# Patient Record
Sex: Female | Born: 1985 | Race: Black or African American | Hispanic: No | Marital: Single | Smoking: Current some day smoker
Health system: Southern US, Community
[De-identification: ages and names within clinical notes are randomized; demographics above are authoritative.]

---

## 2017-10-25 ENCOUNTER — Emergency Department
Admission: EM | Admit: 2017-10-25 | Discharge: 2017-10-25 | Disposition: A | Discharge status: Home / Self Care | Payer: Self-pay | Attending: Emergency Medicine | Admitting: Emergency Medicine

## 2017-10-25 ENCOUNTER — Other Ambulatory Visit: Payer: Self-pay

## 2017-10-25 DIAGNOSIS — L0211 Cutaneous abscess of neck: Secondary | ICD-10-CM | POA: Insufficient documentation

## 2017-10-25 DIAGNOSIS — L0291 Cutaneous abscess, unspecified: Secondary | ICD-10-CM

## 2017-10-25 MED ORDER — HYDROCODONE-ACETAMINOPHEN 5-325 MG PO TABS: 1.0000 | ORAL_TABLET | ORAL | 0 refills | Status: DC | PRN

## 2017-10-25 MED ORDER — LIDOCAINE HCL (PF) 1 % IJ SOLN
INTRAMUSCULAR | Status: AC
  Administered 2017-10-25: 5 mL via INTRADERMAL
  Filled 2017-10-25: qty 5

## 2017-10-25 MED ORDER — SULFAMETHOXAZOLE-TRIMETHOPRIM 800-160 MG PO TABS
1.0000 | ORAL_TABLET | Freq: Once | ORAL | Status: AC
  Administered 2017-10-25: 1 via ORAL
  Filled 2017-10-25: qty 1

## 2017-10-25 MED ORDER — LIDOCAINE HCL (PF) 1 % IJ SOLN
5.0000 mL | Freq: Once | INTRAMUSCULAR | Status: AC
  Administered 2017-10-25: 5 mL via INTRADERMAL

## 2017-10-25 MED ORDER — SULFAMETHOXAZOLE-TRIMETHOPRIM 800-160 MG PO TABS: 1.0000 | ORAL_TABLET | Freq: Two times a day (BID) | ORAL | 0 refills | Status: AC

## 2017-10-25 NOTE — ED Notes (Signed)
Patient c/o painful bump/knot to posterior, upper, medial neck beginning at 0400 today. Patient reports she took ibuprofen, and applied a warm compress with no relief.

## 2017-10-25 NOTE — ED Notes (Signed)
Reviewed discharge instructions, follow-up care, and prescriptions with patient. Patient verbalized understanding of all information reviewed. Patient stable, with no distress noted at this time.    

## 2017-10-25 NOTE — ED Triage Notes (Signed)
Patient presents with "knot on her neck" that woke her up. Tender to touch. No obvious line of demarcation noted, no obvious sign of insect bite.

## 2017-10-25 NOTE — ED Notes (Signed)
ED Provider at bedside. 

## 2017-10-25 NOTE — ED Provider Notes (Signed)
[facility] Emergency Department Provider Note  Time seen: 5:40 AM  I have reviewed the triage vital signs and the nursing notes.   HISTORY  Chief Complaint Abscess    HPI [name] is a 32 y.o. female with no significant past medical history presents to the emergency department for painful area to the back of her neck.  According to the patient she awoke from sleep with a very painful area to the back of her neck.  Felt that it was a very tender bump on the back of her neck.  Denies any fever vomiting.  Has had an abscess previously in her legs but never in her neck.   History reviewed. No pertinent past medical history.  There are no active problems to display for this patient.   History reviewed. No pertinent surgical history.  Prior to Admission medications   Not on File    Allergies  Allergen Reactions  . Penicillins Rash    No family history on file.  Social History Social History   Tobacco Use  . Smoking status: Not on file  Substance Use Topics  . Alcohol use: Not on file  . Drug use: Not on file    Review of Systems Constitutional: Negative for fever. ENT: Posterior neck pain/tenderness Cardiovascular: Negative for chest pain. Respiratory: Negative for shortness of breath. Gastrointestinal: Negative for abdominal pain, vomiting Skin: Tender raised area to the back of the neck Neurological: Negative for headache All other ROS negative  ____________________________________________   PHYSICAL EXAM:  VITAL SIGNS: ED Triage Vitals  Enc Vitals Group     BP 10/25/17 0450 (!) 162/95     Pulse Rate 10/25/17 0450 88     Resp 10/25/17 0450 20     Temp 10/25/17 0450 98.2 F (36.8 C)     Temp Source 10/25/17 0450 Oral     SpO2 10/25/17 0450 100 %     Weight 10/25/17 0451 273 lb (123.8 kg)     Height 10/25/17 0451 5\' 1"  (1.549 m)     Head Circumference --      Peak Flow --      Pain Score 10/25/17 0451 8     Pain Loc --       Pain Edu? --      Excl. in GC? --     Constitutional: Alert and oriented. Well appearing and in no distress. Eyes: Normal exam ENT   Head: Normocephalic and atraumatic.   Mouth/Throat: Mucous membranes are moist. Cardiovascular: Normal rate, regular rhythm. No murmur Respiratory: Normal respiratory effort without tachypnea nor retractions. Breath sounds are clear Gastrointestinal: Soft and nontender. No distention.   Musculoskeletal: Nontender with normal range of motion in all extremities.  Neurologic:  Normal speech and language. No gross focal neurologic deficits  Skin: Patient has an approximate 2 x 2 centimeter area of tenderness and induration to the back of her neck. Psychiatric: Mood and affect are normal.    INITIAL IMPRESSION / ASSESSMENT AND PLAN / ED COURSE  Pertinent labs & imaging results that were available during my care of the patient were reviewed by me and considered in my medical decision making (see chart for details).  She presents to the emergency department with a tender area to the back of the neck.  Differential to include cellulitis, abscess, muscular pain.  Bedside ultrasound performed by myself shows an approximate 1.5 x 1.5 cm area which appears to be consistent with a fluid-filled abscess/cyst.  I  numbed the area with lidocaine inserted an 18-gauge needle and removed approximately 1 to 2 cc of pus.  I then made a larger incision with an 11 blade and used a blunt Q-tip for blunt dissection with an additional 2 cc of pus removed.  Patient states that feeling much better.  We will cover with antibiotics and have the patient follow-up with her doctor.  INCISION AND DRAINAGE Performed by: Minna Antis Consent: Verbal consent obtained. Risks and benefits: risks, benefits and alternatives were discussed Type: abscess  Body area: posterior neck  Anesthesia: local infiltration  Incision was made with a scalpel.  Local anesthetic: lidocaine  1% wo epinephrine  Anesthetic total: 4 ml  Complexity: complex Blunt dissection to break up loculations  Drainage: purulent  Drainage amount: 4cc  Patient tolerance: Patient tolerated the procedure well with no immediate complications.    ____________________________________________   FINAL CLINICAL IMPRESSION(S) / ED DIAGNOSES  Abscess    Minna Antis, MD 10/25/17 [phone]

## 2017-12-14 ENCOUNTER — Emergency Department
Admission: EM | Admit: 2017-12-14 | Discharge: 2017-12-14 | Disposition: A | Discharge status: Home / Self Care | Payer: Self-pay | Attending: Student in an Organized Health Care Education/Training Program | Admitting: Student in an Organized Health Care Education/Training Program

## 2017-12-14 ENCOUNTER — Encounter: Payer: Self-pay | Admitting: Emergency Medicine

## 2017-12-14 ENCOUNTER — Other Ambulatory Visit: Payer: Self-pay

## 2017-12-14 DIAGNOSIS — L0291 Cutaneous abscess, unspecified: Secondary | ICD-10-CM

## 2017-12-14 DIAGNOSIS — L02214 Cutaneous abscess of groin: Secondary | ICD-10-CM | POA: Insufficient documentation

## 2017-12-14 MED ORDER — CLINDAMYCIN HCL 150 MG PO CAPS: 300.0000 mg | ORAL_CAPSULE | Freq: Three times a day (TID) | ORAL | 0 refills | Status: DC

## 2017-12-14 MED ORDER — TRAMADOL HCL 50 MG PO TABS: 50.0000 mg | ORAL_TABLET | Freq: Four times a day (QID) | ORAL | 0 refills | Status: DC | PRN

## 2017-12-14 NOTE — ED Triage Notes (Signed)
Patient complaining of multiple abscesses, has area at panty line and one at bra line. Has used warm compresses without success.  Areas present since Friday.

## 2017-12-14 NOTE — ED Notes (Signed)
See triage note  States she noticed hard swollen area to left groin last Friday  Then also noticed a tender area to right side of back  Near bra line

## 2017-12-14 NOTE — Discharge Instructions (Signed)
Continue to apply warm compresses to the affected area.  Take medication as prescribed.  [facility] on [address].  Return if worsening or call the surgeon whose number is been provided for you.

## 2017-12-14 NOTE — ED Provider Notes (Signed)
[facility] Emergency Department Provider Note  ____________________________________________   First MD Initiated Contact with Patient 12/14/17 [phone]     (approximate)  I have reviewed the triage vital signs and the nursing notes.   HISTORY  Chief Complaint Abscess    HPI Catherine Castro is a 32 y.o. female presents emergency department complaining of a hard swollen area to the left groin since Friday.  She is also got a tender area on the right side of her back in between the folds of her skin.  She states she is been applying warm compresses to the areas and that this usually helps.  She states usually they burst on their own.  She denies any fever or chills at this time.  States it hurts to walk.  History reviewed. No pertinent past medical history.  There are no active problems to display for this patient.   History reviewed. No pertinent surgical history.  Prior to Admission medications   Medication Sig Start Date End Date Taking? Authorizing Provider  clindamycin (CLEOCIN) 150 MG capsule Take 2 capsules (300 mg total) by mouth 3 (three) times daily. 12/14/17   Aaria Happ, Roselyn BeringSusan W, PA-C  traMADol (ULTRAM) 50 MG tablet Take 1 tablet (50 mg total) by mouth every 6 (six) hours as needed. 12/14/17   Faythe GheeFisher, Riker Collier W, PA-C    Allergies Penicillins  No family history on file.  Social History Social History   Tobacco Use  . Smoking status: Not on file  Substance Use Topics  . Alcohol use: Not on file  . Drug use: Not on file    Review of Systems  Constitutional: No fever/chills Eyes: No visual changes. ENT: No sore throat. Respiratory: Denies cough Genitourinary: Negative for dysuria. Musculoskeletal: Negative for back pain. Skin: Negative for rash.  Positive for multiple abscesses    ____________________________________________   PHYSICAL EXAM:  VITAL SIGNS: ED Triage Vitals  Enc Vitals Group     BP 12/14/17 0836 (!) 148/74   Pulse Rate 12/14/17 0836 94     Resp 12/14/17 0836 16     Temp 12/14/17 0836 98.6 F (37 C)     Temp Source 12/14/17 0836 Oral     SpO2 12/14/17 0836 98 %     Weight 12/14/17 0837 275 lb (124.7 kg)     Height 12/14/17 0837 5\' 1"  (1.549 m)     Head Circumference --      Peak Flow --      Pain Score 12/14/17 0836 10     Pain Loc --      Pain Edu? --      Excl. in GC? --     Constitutional: Alert and oriented. Well appearing and in no acute distress. Eyes: Conjunctivae are normal.  Head: Atraumatic. Nose: No congestion/rhinnorhea. Mouth/Throat: Mucous membranes are moist.   Cardiovascular: Normal rate, regular rhythm. Respiratory: Normal respiratory effort.  No retractions GU: deferred Musculoskeletal: FROM all extremities, warm and well perfused Neurologic:  Normal speech and language.  Skin:  Skin is warm, dry and intact. No rash noted.  Positive for a hard indurated area in the left inguinal.  No fluctuance is noted.  No redness is noted.  There is a small pimple on the right upper back in the fold of the skin.  This is also hard with no fluctuance or drainage noted. Psychiatric: Mood and affect are normal. Speech and behavior are normal.  ____________________________________________   LABS (all labs ordered are listed, but  only abnormal results are displayed)  Labs Reviewed - No data to display ____________________________________________   ____________________________________________  RADIOLOGY    ____________________________________________   PROCEDURES  Procedure(s) performed: No  Procedures    ____________________________________________   INITIAL IMPRESSION / ASSESSMENT AND PLAN / ED COURSE  Pertinent labs & imaging results that were available during my care of the patient were reviewed by me and considered in my medical decision making (see chart for details).  Patient is 32 year old female presents emergency department complaining of 2 abscesses.   She states her son usually open on their own with warm compresses but this is not helped.  On physical exam the left inguinal area has a hard indurated area with no fluctuance or drainage noted.  The right upper back has a quarter sized pimple which is still hard and not fluctuant with no drainage.  Explained the findings to the patient.  Explained to her at this time if we lanced these their own would be blood as they are not fluctuant.  She is to continue to apply the warm compresses.  Take medication as prescribed.  Return to the emergency department or call surgeon if worsening.  She states she understands to comply with instructions.  Was discharged in stable condition     As part of my medical decision making, I reviewed the following data within the electronic MEDICAL RECORD NUMBER Nursing notes reviewed and incorporated, Old chart reviewed, Notes from prior ED visits and [place] Controlled Substance Database  ____________________________________________   FINAL CLINICAL IMPRESSION(S) / ED DIAGNOSES  Final diagnoses:  Abscess      NEW MEDICATIONS STARTED DURING THIS VISIT:  New Prescriptions   CLINDAMYCIN (CLEOCIN) 150 MG CAPSULE    Take 2 capsules (300 mg total) by mouth 3 (three) times daily.   TRAMADOL (ULTRAM) 50 MG TABLET    Take 1 tablet (50 mg total) by mouth every 6 (six) hours as needed.     Note:  This document was prepared using Dragon voice recognition software and may include unintentional dictation errors.    Faythe Ghee, PA-C 12/14/17 0910    Willy Eddy, MD 12/14/17 1023

## 2018-07-23 ENCOUNTER — Emergency Department
Admission: EM | Admit: 2018-07-23 | Discharge: 2018-07-23 | Disposition: A | Discharge status: Home / Self Care | Payer: Self-pay | Attending: Emergency Medicine | Admitting: Emergency Medicine

## 2018-07-23 ENCOUNTER — Emergency Department: Payer: Self-pay

## 2018-07-23 ENCOUNTER — Other Ambulatory Visit: Payer: Self-pay

## 2018-07-23 ENCOUNTER — Encounter: Payer: Self-pay | Admitting: Emergency Medicine

## 2018-07-23 DIAGNOSIS — Z87891 Personal history of nicotine dependence: Secondary | ICD-10-CM | POA: Insufficient documentation

## 2018-07-23 DIAGNOSIS — M791 Myalgia, unspecified site: Secondary | ICD-10-CM | POA: Insufficient documentation

## 2018-07-23 DIAGNOSIS — B349 Viral infection, unspecified: Secondary | ICD-10-CM | POA: Insufficient documentation

## 2018-07-23 DIAGNOSIS — R197 Diarrhea, unspecified: Secondary | ICD-10-CM | POA: Insufficient documentation

## 2018-07-23 DIAGNOSIS — R1110 Vomiting, unspecified: Secondary | ICD-10-CM | POA: Insufficient documentation

## 2018-07-23 DIAGNOSIS — R109 Unspecified abdominal pain: Secondary | ICD-10-CM | POA: Insufficient documentation

## 2018-07-23 LAB — BASIC METABOLIC PANEL
Anion gap: 8 (ref 5–15)
BUN: 11 mg/dL (ref 6–20)
CALCIUM: 9.1 mg/dL (ref 8.9–10.3)
CO2: 25 mmol/L (ref 22–32)
CREATININE: 0.71 mg/dL (ref 0.44–1.00)
Chloride: 103 mmol/L (ref 98–111)
GFR calc Af Amer: >60 mL/min (ref >60)
GLUCOSE: 96 mg/dL (ref 70–99)
POTASSIUM: 3.8 mmol/L (ref 3.5–5.1)
Sodium: 136 mmol/L (ref 135–145)

## 2018-07-23 LAB — CBC WITH DIFFERENTIAL/PLATELET
Abs Immature Granulocytes: 0.01 10*3/uL (ref 0.00–0.07)
Basophils Absolute: 0 10*3/uL (ref 0.0–0.1)
Basophils Relative: 0 %
EOS ABS: 0.2 10*3/uL (ref 0.0–0.5)
EOS PCT: 3 %
HCT: 41.6 % (ref 36.0–46.0)
Hemoglobin: 13.5 g/dL (ref 12.0–15.0)
Immature Granulocytes: 0 %
LYMPHS ABS: 2 10*3/uL (ref 0.7–4.0)
Lymphocytes Relative: 39 %
MCH: 27.7 pg (ref 26.0–34.0)
MCHC: 32.5 g/dL (ref 30.0–36.0)
MCV: 85.2 fL (ref 80.0–100.0)
MONO ABS: 0.4 10*3/uL (ref 0.1–1.0)
MONOS PCT: 8 %
NEUTROS PCT: 50 %
NRBC: 0 % (ref 0.0–0.2)
Neutro Abs: 2.7 10*3/uL (ref 1.7–7.7)
PLATELETS: 400 10*3/uL (ref 150–400)
RBC: 4.88 MIL/uL (ref 3.87–5.11)
RDW: 14 % (ref 11.5–15.5)
WBC: 5.3 10*3/uL (ref 4.0–10.5)

## 2018-07-23 MED ORDER — IBUPROFEN 600 MG PO TABS
600.0000 mg | ORAL_TABLET | Freq: Once | ORAL | Status: DC
  Filled 2018-07-23: qty 1

## 2018-07-23 MED ORDER — GUAIFENESIN-CODEINE 100-10 MG/5ML PO SOLN: 10.0000 mL | Freq: Four times a day (QID) | ORAL | 0 refills | Status: DC | PRN

## 2018-07-23 MED ORDER — ALBUTEROL SULFATE (2.5 MG/3ML) 0.083% IN NEBU
2.5000 mg | INHALATION_SOLUTION | Freq: Once | RESPIRATORY_TRACT | Status: AC
  Administered 2018-07-23: 2.5 mg via RESPIRATORY_TRACT
  Filled 2018-07-23: qty 3

## 2018-07-23 NOTE — ED Triage Notes (Signed)
Pt here with c/o lower abd pain, diarrhea, vomiting and cough for the past few days, unable to keep food down, appears in NAD.

## 2018-07-23 NOTE — Discharge Instructions (Signed)
Return to the ER for new, worsening, or persistent severe cough, vomiting, abdominal pain, high fevers, weakness, or any other new or worsening symptoms that concern you.  Continue to take ibuprofen every 6-8 hours as needed for body aches or fever.

## 2018-07-23 NOTE — ED Provider Notes (Signed)
[facility] Emergency Department Provider Note ____________________________________________   First MD Initiated Contact with Patient 07/23/18 [phone]     (approximate)  I have reviewed the triage vital signs and the nursing notes.   HISTORY  Chief Complaint Abdominal Pain; Diarrhea; Cough; and Emesis    HPI Catherine Castro is a 33 y.o. female with no significant PMH who presents with cough over the last several days, nonproductive, associated with a few episodes of posttussive emesis, and with body aches, crampy abdominal pain, and diarrhea.  The patient denies any sick contacts, travel, or other recent illness.  History reviewed. No pertinent past medical history.  There are no active problems to display for this patient.   History reviewed. No pertinent surgical history.  Prior to Admission medications   Medication Sig Start Date End Date Taking? Authorizing Provider  clindamycin (CLEOCIN) 150 MG capsule Take 2 capsules (300 mg total) by mouth 3 (three) times daily. 12/14/17   Fisher, Roselyn Bering, PA-C  guaiFENesin-codeine 100-10 MG/5ML syrup Take 10 mLs by mouth every 6 (six) hours as needed for cough. 07/23/18   Dionne Bucy, MD  traMADol (ULTRAM) 50 MG tablet Take 1 tablet (50 mg total) by mouth every 6 (six) hours as needed. 12/14/17   Faythe Ghee, PA-C    Allergies Penicillins  No family history on file.  Social History Social History   Tobacco Use  . Smoking status: Former Games developer  . Smokeless tobacco: Never Used  Substance Use Topics  . Alcohol use: Not Currently  . Drug use: Not on file    Review of Systems  Constitutional: No fever. Eyes: No redness. ENT: Positive for resolved nasal congestion. Cardiovascular: Denies chest pain. Respiratory: Denies shortness of breath.  Positive for cough. Gastrointestinal: Positive for diarrhea.  Genitourinary: Negative for dysuria.  Musculoskeletal: Positive for body aches. Skin:  Negative for rash. Neurological: Negative for headache.   ____________________________________________   PHYSICAL EXAM:  VITAL SIGNS: ED Triage Vitals  Enc Vitals Group     BP 07/23/18 0830 133/67     Pulse Rate 07/23/18 0830 88     Resp 07/23/18 0830 18     Temp 07/23/18 0830 98.3 F (36.8 C)     Temp Source 07/23/18 0830 Oral     SpO2 07/23/18 0830 99 %     Weight 07/23/18 0828 290 lb (131.5 kg)     Height 07/23/18 0828 5\' 5"  (1.651 m)     Head Circumference --      Peak Flow --      Pain Score 07/23/18 0828 6     Pain Loc --      Pain Edu? --      Excl. in GC? --     Constitutional: Alert and oriented.  Relatively well appearing and in no acute distress. Eyes: Conjunctivae are normal.  Head: Atraumatic. Nose: No congestion/rhinnorhea. Mouth/Throat: Mucous membranes are moist.  Oropharynx is clear with no erythema or exudate. Neck: Normal range of motion.  Cardiovascular: Normal rate, regular rhythm. Grossly normal heart sounds.  Good peripheral circulation. Respiratory: Normal respiratory effort.  No retractions. Lungs CTAB. Gastrointestinal: Soft with no focal tenderness. No distention.  Genitourinary: No flank tenderness. Musculoskeletal: Extremities warm and well perfused.  Neurologic:  Normal speech and language. No gross focal neurologic deficits are appreciated.  Skin:  Skin is warm and dry. No rash noted. Psychiatric: Mood and affect are normal. Speech and behavior are normal.  ____________________________________________   LABS (all  labs ordered are listed, but only abnormal results are displayed)  Labs Reviewed  CBC WITH DIFFERENTIAL/PLATELET  BASIC METABOLIC PANEL   ____________________________________________  EKG   ____________________________________________  RADIOLOGY  CXR: No focal infiltrate or other acute abnormality  ____________________________________________   PROCEDURES  Procedure(s) performed: No  Procedures  Critical  Care performed: No ____________________________________________   INITIAL IMPRESSION / ASSESSMENT AND PLAN / ED COURSE  Pertinent labs & imaging results that were available during my care of the patient were reviewed by me and considered in my medical decision making (see chart for details).  33 year old female with no significant PMH presents with nonproductive cough, some vomiting, diarrhea, and body aches over the last several days.  On exam, the patient is well-appearing and her vital signs are normal.  Respiratory rate was recorded as 31, however during my exam the patient had no tachypnea or increased work of breathing.  Lungs are clear and the remainder the exam is as described above.  Overall presentation is consistent with likely viral syndrome with probable component of bronchitis.  We will obtain chest x-ray to rule out pneumonia as well as basic labs.  I will give symptomatic treatment with ibuprofen and albuterol nebulizer.  ----------------------------------------- 11:13 AM on 07/23/2018 -----------------------------------------  Chest x-ray is negative and the lab work-up is unremarkable.  The patient is stable for discharge home.  I counseled her on the results of the work-up.  Return precautions given, and she expresses understanding.  ____________________________________________   FINAL CLINICAL IMPRESSION(S) / ED DIAGNOSES  Final diagnoses:  Viral syndrome      NEW MEDICATIONS STARTED DURING THIS VISIT:  New Prescriptions   GUAIFENESIN-CODEINE 100-10 MG/5ML SYRUP    Take 10 mLs by mouth every 6 (six) hours as needed for cough.     Note:  This document was prepared using Dragon voice recognition software and may include unintentional dictation errors.    Dionne Bucy, MD 07/23/18 1113

## 2018-10-18 ENCOUNTER — Other Ambulatory Visit: Payer: Self-pay

## 2018-10-18 ENCOUNTER — Emergency Department
Admission: EM | Admit: 2018-10-18 | Discharge: 2018-10-18 | Disposition: A | Discharge status: Home / Self Care | Payer: Self-pay | Attending: Emergency Medicine | Admitting: Emergency Medicine

## 2018-10-18 ENCOUNTER — Emergency Department: Payer: Self-pay

## 2018-10-18 DIAGNOSIS — Z87891 Personal history of nicotine dependence: Secondary | ICD-10-CM | POA: Insufficient documentation

## 2018-10-18 DIAGNOSIS — M545 Low back pain, unspecified: Secondary | ICD-10-CM

## 2018-10-18 DIAGNOSIS — M79672 Pain in left foot: Secondary | ICD-10-CM | POA: Insufficient documentation

## 2018-10-18 LAB — URINALYSIS, COMPLETE (UACMP) WITH MICROSCOPIC
Bacteria, UA: NONE SEEN
Bilirubin Urine: NEGATIVE
Glucose, UA: NEGATIVE mg/dL
Ketones, ur: NEGATIVE mg/dL
Leukocytes,Ua: NEGATIVE
Nitrite: NEGATIVE
Protein, ur: NEGATIVE mg/dL
Specific Gravity, Urine: 1.017 (ref 1.005–1.030)
pH: 5 (ref 5.0–8.0)

## 2018-10-18 LAB — POCT PREGNANCY, URINE: Preg Test, Ur: NEGATIVE

## 2018-10-18 MED ORDER — NAPROXEN 500 MG PO TABS: 500.0000 mg | ORAL_TABLET | Freq: Two times a day (BID) | ORAL | 0 refills | Status: DC

## 2018-10-18 MED ORDER — KETOROLAC TROMETHAMINE 30 MG/ML IJ SOLN
30.0000 mg | Freq: Once | INTRAMUSCULAR | Status: AC
  Administered 2018-10-18: 30 mg via INTRAMUSCULAR
  Filled 2018-10-18: qty 1

## 2018-10-18 NOTE — Discharge Instructions (Signed)
Follow-up with Dr. Ether Griffins who is the podiatrist on call for [facility] if you continue to have problems with your foot.  Continue wearing your supportive shoes when working.  Discontinue taking ibuprofen at this time.  Begin taking naproxen 500 mg twice daily with food every day for the next 10 days.  You may use ice to your foot as needed for discomfort.  X-rays of your foot did show heel spurs which can cause increased inflammation of the muscle tissue in your foot.

## 2018-10-18 NOTE — ED Triage Notes (Signed)
Pt c/o lower back pain and left foot pain for the past week, states she cant get an apt until next monday

## 2018-10-18 NOTE — ED Provider Notes (Signed)
[facility] Emergency Department Provider Note   ____________________________________________   First MD Initiated Contact with Patient 10/18/18 [phone]     (approximate)  I have reviewed the triage vital signs and the nursing notes.   HISTORY  Chief Complaint Back Pain and Foot Pain   HPI Governor RooksRobin L Schexnayder is a 33 y.o. female presents to the ED with complaint of left foot pain without known injury x2 days.  Patient states that she noticed some swelling in her ankle on Saturday.  There is been no injury to her back.  She denies any urinary symptoms or history of kidney stones.  She states the back pain began after she began having difficulty walking on her foot and believes that this is due to to her gait change.  She has been taking ibuprofen 800 mg once daily for her foot pain.  She rates her pain as 6 out of 10.      History reviewed. No pertinent past medical history.  There are no active problems to display for this patient.   History reviewed. No pertinent surgical history.  Prior to Admission medications   Medication Sig Start Date End Date Taking? Authorizing Provider  naproxen (NAPROSYN) 500 MG tablet Take 1 tablet (500 mg total) by mouth 2 (two) times daily with a meal. 10/18/18   Tommi RumpsSummers, Kasheem Toner L, PA-C    Allergies Penicillins  No family history on file.  Social History Social History   Tobacco Use  . Smoking status: Former Games developermoker  . Smokeless tobacco: Never Used  Substance Use Topics  . Alcohol use: Not Currently  . Drug use: Not on file    Review of Systems Constitutional: No fever/chills Cardiovascular: Denies chest pain. Respiratory: Denies shortness of breath. Gastrointestinal: No abdominal pain.  No nausea, no vomiting.  Genitourinary: Negative for dysuria. Musculoskeletal: Positive for left foot pain.  Positive for low back pain. Skin: Negative for rash. Neurological: Negative for headaches, focal weakness or numbness.  ____________________________________________   PHYSICAL EXAM:  VITAL SIGNS: ED Triage Vitals [10/18/18 0858]  Enc Vitals Group     BP 126/81     Pulse Rate 88     Resp 17     Temp 98 F (36.7 C)     Temp Source Oral     SpO2 100 %     Weight 281 lb (127.5 kg)     Height 5\' 1"  (1.549 m)     Head Circumference      Peak Flow      Pain Score 6     Pain Loc      Pain Edu?      Excl. in GC?    Constitutional: Alert and oriented. Well appearing and in no acute distress. Eyes: Conjunctivae are normal.  Head: Atraumatic. Neck: No stridor.   Cardiovascular: Normal rate, regular rhythm. Grossly normal heart sounds.  Good peripheral circulation. Respiratory: Normal respiratory effort.  No retractions. Lungs CTAB. Gastrointestinal: Soft and nontender. No distention.  Musculoskeletal: On examination of the back there is no gross deformity and no point tenderness on palpation of the thorax or lumbar spine.  Range of motion is without restriction or muscle spasm seen.  No soft tissue injury noted.  On examination of the left foot there is no gross deformity however the lateral aspect of her left foot including the fourth and fifth metatarsals are tender to palpation.  No puncture wound or skin abrasions are noted.  Capillary refill is less than  3 seconds.  Patient motor sensory function intact and able to move without any restriction.  Nontender ankle to palpation. Neurologic:  Normal speech and language. No gross focal neurologic deficits are appreciated. No gait instability. Skin:  Skin is warm, dry and intact. No rash noted.  No skin discoloration. Psychiatric: Mood and affect are normal. Speech and behavior are normal.  ____________________________________________   LABS (all labs ordered are listed, but only abnormal results are displayed)  Labs Reviewed  URINALYSIS, COMPLETE (UACMP) WITH MICROSCOPIC - Abnormal; Notable for the following components:      Result Value   Color, Urine  YELLOW (*)    APPearance HAZY (*)    Hgb urine dipstick SMALL (*)    All other components within normal limits  POC URINE PREG, ED  POCT PREGNANCY, URINE    RADIOLOGY  Official radiology report(s): Dg Foot Complete Left  Result Date: 10/18/2018 CLINICAL DATA:  Plantar left foot pain for the past 3 days. No injury. EXAM: LEFT FOOT - COMPLETE 3+ VIEW COMPARISON:  None. FINDINGS: No acute fracture or dislocation. Joint spaces are preserved. Plantar and Achilles enthesophytes. Bone mineralization is normal. Soft tissues are unremarkable. IMPRESSION: 1. No acute osseous abnormality or significant degenerative changes. 2. Calcaneal enthesopathy. Electronically Signed   By: Obie Dredge M.D.   On: 10/18/2018 09:54    ____________________________________________   PROCEDURES  Procedure(s) performed (including Critical Care):  Procedures   ____________________________________________   INITIAL IMPRESSION / ASSESSMENT AND PLAN / ED COURSE  As part of my medical decision making, I reviewed the following data within the electronic MEDICAL RECORD NUMBER Notes from prior ED visits and [place] Controlled Substance Database  33 year old female presents to the ED with complaint of left foot pain and bilateral low back pain.  Patient denies any known injury.  She states she has been walking differently due to her left foot pain.  She has been taking ibuprofen 800 mg once daily without any relief of her pain.  Urinalysis was negative for hematuria or infection.  X-rays were discussed with the patient and she was made aware that she does have calcaneal spurs.  Patient was given Toradol 30 mg IM while in the ED, she is to discontinue taking ibuprofen.  She was given a prescription for naproxen 500 mg twice daily with food for the next 10 days.  We discussed wearing supportive shoes when she is working.  She will follow-up with Dr. Ether Griffins who is on-call for podiatry if any continued problems with her foot.  ____________________________________________   FINAL CLINICAL IMPRESSION(S) / ED DIAGNOSES  Final diagnoses:  Left foot pain  Acute bilateral low back pain without sciatica     ED Discharge Orders         Ordered    naproxen (NAPROSYN) 500 MG tablet  2 times daily with meals     10/18/18 1014           Note:  This document was prepared using Dragon voice recognition software and may include unintentional dictation errors.    Tommi Rumps, PA-C 10/18/18 1458    Shaune Pollack, MD 10/18/18 1925

## 2018-10-18 NOTE — ED Notes (Signed)
See triage note  Presents with pain to bottom of left foot and some swelling into ankle since Saturday   Denies any injury  Also having some left lower back pain

## 2019-01-21 ENCOUNTER — Emergency Department
Admission: EM | Admit: 2019-01-21 | Discharge: 2019-01-21 | Disposition: A | Discharge status: Home / Self Care | Payer: Self-pay | Attending: Emergency Medicine | Admitting: Emergency Medicine

## 2019-01-21 ENCOUNTER — Other Ambulatory Visit: Payer: Self-pay

## 2019-01-21 DIAGNOSIS — L03313 Cellulitis of chest wall: Secondary | ICD-10-CM | POA: Insufficient documentation

## 2019-01-21 DIAGNOSIS — Z87891 Personal history of nicotine dependence: Secondary | ICD-10-CM | POA: Insufficient documentation

## 2019-01-21 MED ORDER — SULFAMETHOXAZOLE-TRIMETHOPRIM 800-160 MG PO TABS
1.0000 | ORAL_TABLET | Freq: Once | ORAL | Status: AC
  Administered 2019-01-21: 07:00:00 1 via ORAL
  Filled 2019-01-21: qty 1

## 2019-01-21 MED ORDER — SULFAMETHOXAZOLE-TRIMETHOPRIM 800-160 MG PO TABS: 1.0000 | ORAL_TABLET | Freq: Two times a day (BID) | ORAL | 0 refills | Status: AC

## 2019-01-21 MED ORDER — OXYCODONE-ACETAMINOPHEN 5-325 MG PO TABS: 1.0000 | ORAL_TABLET | ORAL | 0 refills | Status: DC | PRN

## 2019-01-21 MED ORDER — OXYCODONE-ACETAMINOPHEN 5-325 MG PO TABS
1.0000 | ORAL_TABLET | Freq: Once | ORAL | Status: AC
  Administered 2019-01-21: 07:00:00 1 via ORAL
  Filled 2019-01-21: qty 1

## 2019-01-21 NOTE — ED Triage Notes (Signed)
Pt arrives to ED via POV from home with c/o abscess on the left side "right under my bra line" that started 3 days ago. Pt denies any drainage from site. Pt states she had another one "that busted, but this one won't go away". No reported fever; no N/V/D.

## 2019-01-21 NOTE — ED Provider Notes (Signed)
[facility] Emergency Department Provider Note   First MD Initiated Contact with Patient 01/21/19 0602     (approximate)  I have reviewed the triage vital signs and the nursing notes.   HISTORY  Chief Complaint Abscess  HPI Catherine Castro is a 33 y.o. female presents to the emergency department secondary to  concern for possible beginning stages of an abscess along the lateral portion her bra line on the right.  Patient states that symptoms have been present for the past 3 days.  Patient denies any fever afebrile on presentation.  Patient states that pain minimally improved with ibuprofen and Tylenol.  Patient states that she has had multiple abscesses in the past.        History reviewed. No pertinent past medical history.  There are no active problems to display for this patient.   History reviewed. No pertinent surgical history.  Prior to Admission medications   Medication Sig Start Date End Date Taking? Authorizing Provider  naproxen (NAPROSYN) 500 MG tablet Take 1 tablet (500 mg total) by mouth 2 (two) times daily with a meal. 10/18/18   Tommi RumpsSummers, Rhonda L, PA-C    Allergies Penicillins  No family history on file.  Social History Social History   Tobacco Use  . Smoking status: Former Games developermoker  . Smokeless tobacco: Never Used  Substance Use Topics  . Alcohol use: Not Currently  . Drug use: Not on file    Review of Systems Constitutional: No fever/chills Eyes: No visual changes. ENT: No sore throat. Cardiovascular: Denies chest pain. Respiratory: Denies shortness of breath. Gastrointestinal: No abdominal pain.  No nausea, no vomiting.  No diarrhea.  No constipation. Genitourinary: Negative for dysuria. Musculoskeletal: Negative for neck pain.  Negative for back pain. Integumentary: Negative for rash.  Positive for possible abscess Neurological: Negative for headaches, focal weakness or numbness.   ____________________________________________   PHYSICAL EXAM:  VITAL SIGNS: ED Triage Vitals [01/21/19 0218]  Enc Vitals Group     BP (!) 143/85     Pulse Rate 91     Resp 17     Temp 98.5 F (36.9 C)     Temp Source Oral     SpO2 99 %     Weight 128.4 kg (283 lb)     Height 1.549 m (5\' 1" )     Head Circumference      Peak Flow      Pain Score 10     Pain Loc      Pain Edu?      Excl. in GC?     Constitutional: Alert and oriented.  Eyes: Conjunctivae are normal.  Mouth/Throat: Mucous membranes are moist. Neck: No stridor.  No meningeal signs.   Cardiovascular: Normal rate, regular rhythm. Good peripheral circulation. Grossly normal heart sounds. Respiratory: Normal respiratory effort.  No retractions.  Musculoskeletal: No lower extremity tenderness nor edema. No gross deformities of extremities. Neurologic:  Normal speech and language. No gross focal neurologic deficits are appreciated.  Skin: Blanching erythema anterior axillary line on the right no flocculence appreciated Psychiatric: Mood and affect are normal. Speech and behavior are normal.     Procedure(s) performed (including Critical Care):  Procedures   ____________________________________________   INITIAL IMPRESSION / MDM / ASSESSMENT AND PLAN / ED COURSE  As part of my medical decision making, I reviewed the following data within the electronic MEDICAL RECORD NUMBER   33 year old female present with above-stated history and physical exam consistent with cellulitis  with no abscess formation at this time.  Patient will be given Bactrim DS and Percocet in emergency department prescribed the same for home.  I spoke with the patient at length regarding warning signs that would warrant immediate return to the emergency department for further evaluation  ____________________________________________  FINAL CLINICAL IMPRESSION(S) / ED DIAGNOSES  Final diagnoses:  Cellulitis of chest wall     MEDICATIONS  GIVEN DURING THIS VISIT:  Medications - No data to display   ED Discharge Orders    None      *Please note:  Catherine Castro was evaluated in Emergency Department on 01/21/2019 for the symptoms described in the history of present illness. She was evaluated in the context of the global COVID-19 pandemic, which necessitated consideration that the patient might be at risk for infection with the SARS-CoV-2 virus that causes COVID-19. Institutional protocols and algorithms that pertain to the evaluation of patients at risk for COVID-19 are in a state of rapid change based on information released by regulatory bodies including the CDC and federal and state organizations. These policies and algorithms were followed during the patient's care in the ED.  Some ED evaluations and interventions may be delayed as a result of limited staffing during the pandemic.*  Note:  This document was prepared using Dragon voice recognition software and may include unintentional dictation errors.   Gregor Hams, MD 01/21/19 [phone]

## 2019-01-24 ENCOUNTER — Emergency Department
Admission: EM | Admit: 2019-01-24 | Discharge: 2019-01-24 | Disposition: A | Discharge status: Home / Self Care | Payer: Self-pay | Attending: Emergency Medicine | Admitting: Emergency Medicine

## 2019-01-24 ENCOUNTER — Other Ambulatory Visit: Payer: Self-pay

## 2019-01-24 ENCOUNTER — Encounter: Payer: Self-pay | Admitting: Emergency Medicine

## 2019-01-24 DIAGNOSIS — Z87891 Personal history of nicotine dependence: Secondary | ICD-10-CM | POA: Insufficient documentation

## 2019-01-24 DIAGNOSIS — L02213 Cutaneous abscess of chest wall: Secondary | ICD-10-CM | POA: Insufficient documentation

## 2019-01-24 DIAGNOSIS — L0291 Cutaneous abscess, unspecified: Secondary | ICD-10-CM

## 2019-01-24 MED ORDER — OXYCODONE-ACETAMINOPHEN 5-325 MG PO TABS: 1.0000 | ORAL_TABLET | ORAL | 0 refills | Status: AC | PRN

## 2019-01-24 MED ORDER — OXYCODONE-ACETAMINOPHEN 5-325 MG PO TABS: 1.0000 | ORAL_TABLET | ORAL | 0 refills | Status: DC | PRN

## 2019-01-24 MED ORDER — LIDOCAINE HCL (PF) 1 % IJ SOLN
5.0000 mL | Freq: Once | INTRAMUSCULAR | Status: AC
Start: 2019-01-24 — End: 2019-01-24
  Administered 2019-01-24: 16:00:00 5 mL via INTRADERMAL
  Filled 2019-01-24: qty 5

## 2019-01-24 MED ORDER — OXYCODONE-ACETAMINOPHEN 5-325 MG PO TABS
1.0000 | ORAL_TABLET | Freq: Once | ORAL | Status: AC
Start: 2019-01-24 — End: 2019-01-24
  Administered 2019-01-24: 1 via ORAL
  Filled 2019-01-24: qty 1

## 2019-01-24 MED ORDER — LIDOCAINE-EPINEPHRINE-TETRACAINE (LET) SOLUTION
3.0000 mL | Freq: Once | NASAL | Status: AC
  Administered 2019-01-24: 3 mL via TOPICAL
  Filled 2019-01-24: qty 3

## 2019-01-24 NOTE — ED Provider Notes (Signed)
[facility] Emergency Department Provider Note  ____________________________________________   First MD Initiated Contact with Patient 01/24/19 [phone]     (approximate)  I have reviewed the triage vital signs and the nursing notes.   HISTORY  Chief Complaint Abscess    HPI Catherine Castro is a 33 y.o. female presents emergency department for recheck of an abscess to the right rib area.  She was seen here Friday night given a antibiotic and pain medication.  She states the area has enlarged and become fluctuant.  She denies any fever or chills.  States area is very painful.    History reviewed. No pertinent past medical history.  There are no active problems to display for this patient.   History reviewed. No pertinent surgical history.  Prior to Admission medications   Medication Sig Start Date End Date Taking? Authorizing Provider  oxyCODONE-acetaminophen (PERCOCET) 5-325 MG tablet Take 1 tablet by mouth every 4 (four) hours as needed for up to 3 days. 01/24/19 01/27/19  Cleophas Yoak, Roselyn BeringSusan W, PA-C  sulfamethoxazole-trimethoprim (BACTRIM DS) 800-160 MG tablet Take 1 tablet by mouth 2 (two) times daily for 10 days. 01/21/19 01/31/19  Darci CurrentBrown, Diamond N, MD    Allergies Penicillins  No family history on file.  Social History Social History   Tobacco Use  . Smoking status: Former Games developermoker  . Smokeless tobacco: Never Used  Substance Use Topics  . Alcohol use: Not Currently  . Drug use: Not on file    Review of Systems  Constitutional: No fever/chills Eyes: No visual changes. ENT: No sore throat. Respiratory: Denies cough Genitourinary: Negative for dysuria. Musculoskeletal: Negative for back pain. Skin: Negative for rash.  Positive for abscess    ____________________________________________   PHYSICAL EXAM:  VITAL SIGNS: ED Triage Vitals  Enc Vitals Group     BP 01/24/19 1514 (!) 142/92     Pulse Rate 01/24/19 1514 100     Resp 01/24/19  1514 18     Temp 01/24/19 1514 98.6 F (37 C)     Temp Source 01/24/19 1514 Oral     SpO2 01/24/19 1514 97 %     Weight 01/24/19 1517 285 lb (129.3 kg)     Height 01/24/19 1517 5\' 1"  (1.549 m)     Head Circumference --      Peak Flow --      Pain Score --      Pain Loc --      Pain Edu? --      Excl. in GC? --     Constitutional: Alert and oriented. Well appearing and in no acute distress. Eyes: Conjunctivae are normal.  Head: Atraumatic. Nose: No congestion/rhinnorhea. Mouth/Throat: Mucous membranes are moist.   Neck:  supple no lymphadenopathy noted Cardiovascular: Normal rate, regular rhythm. Heart sounds are normal Respiratory: Normal respiratory effort.  No retractions, lungs c t a  GU: deferred Musculoskeletal: FROM all extremities, warm and well perfused Neurologic:  Normal speech and language.  Skin:  Skin is warm, dry and intact.  Positive for fluctuant abscess noted on the right ribs, measures about 5 inches x 2 inches.   Psychiatric: Mood and affect are normal. Speech and behavior are normal.  ____________________________________________   LABS (all labs ordered are listed, but only abnormal results are displayed)  Labs Reviewed - No data to display ____________________________________________   ____________________________________________  RADIOLOGY    ____________________________________________   PROCEDURES  Procedure(s) performed:   Marland Kitchen.Marland Kitchen.Incision and Drainage  Date/Time: 01/24/2019 5:23  PM Performed by: Versie Starks, PA-C Authorized by: Versie Starks, PA-C   Consent:    Consent obtained:  Verbal   Consent given by:  Patient   Risks discussed:  Bleeding, incomplete drainage, pain and infection Location:    Type:  Abscess   Location:  Trunk   Trunk location:  Chest Pre-procedure details:    Skin preparation:  Betadine Anesthesia (see MAR for exact dosages):    Anesthesia method:  Topical application and local infiltration   Topical  anesthetic:  LET   Local anesthetic:  Lidocaine 1% w/o epi Procedure type:    Complexity:  Simple Procedure details:    Incision types:  Stab incision   Incision depth:  Dermal   Scalpel blade:  11   Wound management:  Probed and deloculated and irrigated with saline   Drainage:  Bloody and purulent   Drainage amount:  Copious   Wound treatment:  Drain placed   Packing materials:  1/4 in iodoform gauze Post-procedure details:    Patient tolerance of procedure:  Tolerated well, no immediate complications      ____________________________________________   INITIAL IMPRESSION / ASSESSMENT AND PLAN / ED COURSE  Pertinent labs & imaging results that were available during my care of the patient were reviewed by me and considered in my medical decision making (see chart for details).   Patient is 33 year old female presents emergency department complaining of worsening abscess.  Patient's been taking Septra and Vicodin without relief.  Physical exam patient appears to have a large fluctuant abscess on the right ribs.  See procedure note for I&D  Patient was given additional pain medication.  She is to finish her antibiotic.  Return in 2 days for packing removal.  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  She states she understands will comply.  She was discharged in stable condition.   Catherine Castro was evaluated in Emergency Department on 01/24/2019 for the symptoms described in the history of present illness. She was evaluated in the context of the global COVID-19 pandemic, which necessitated consideration that the patient might be at risk for infection with the SARS-CoV-2 virus that causes COVID-19. Institutional protocols and algorithms that pertain to the evaluation of patients at risk for COVID-19 are in a state of rapid change based on information released by regulatory bodies including the  CDC and federal and state organizations. These policies and algorithms were followed during the patient's care in the ED.   As part of my medical decision making, I reviewed the following data within the [place] notes reviewed and incorporated, Old chart reviewed, Notes from prior ED visits and [place] Controlled Substance Database  ____________________________________________   FINAL CLINICAL IMPRESSION(S) / ED DIAGNOSES  Final diagnoses:  Abscess      NEW MEDICATIONS STARTED DURING THIS VISIT:  Discharge Medication List as of 01/24/2019  5:02 PM       Note:  This document was prepared using Dragon voice recognition software and may include unintentional dictation errors.    Versie Starks, PA-C 01/24/19 1728    Nance Pear, MD 01/24/19 [phone]

## 2019-01-24 NOTE — ED Triage Notes (Signed)
Patient presents to the ED with abscess to right breast.  Patient was seen on Friday and prescribed antibiotics.  Patient states area has gotten larger and more painful.  Patient denies fever, nausea, vomiting and diarrhea.

## 2019-01-24 NOTE — Discharge Instructions (Signed)
Return to the emergency department in 2 days for recheck and packing removal.  Take your antibiotic and pain medication as prescribed.  Have given you additional pain medication if needed.  Sent this to [facility].  If you start to run a fever or experiencing increased pain or swelling please return emergency department.

## 2019-01-24 NOTE — ED Notes (Signed)
See triage note  Presents for recheck of abscess area to right lateral rib area ,at her bra line  Was placed on antibiotics last week  States area is larger and more painful

## 2019-04-25 ENCOUNTER — Emergency Department
Admission: EM | Admit: 2019-04-25 | Discharge: 2019-04-25 | Disposition: A | Discharge status: Home / Self Care | Payer: Self-pay | Attending: Emergency Medicine | Admitting: Emergency Medicine

## 2019-04-25 ENCOUNTER — Other Ambulatory Visit: Payer: Self-pay

## 2019-04-25 DIAGNOSIS — L03313 Cellulitis of chest wall: Secondary | ICD-10-CM

## 2019-04-25 DIAGNOSIS — Z87891 Personal history of nicotine dependence: Secondary | ICD-10-CM | POA: Insufficient documentation

## 2019-04-25 MED ORDER — TRAMADOL HCL 50 MG PO TABS: 50.0000 mg | ORAL_TABLET | Freq: Four times a day (QID) | ORAL | 0 refills | Status: DC | PRN

## 2019-04-25 MED ORDER — SULFAMETHOXAZOLE-TRIMETHOPRIM 800-160 MG PO TABS: 1.0000 | ORAL_TABLET | Freq: Two times a day (BID) | ORAL | 0 refills | Status: DC

## 2019-04-25 NOTE — ED Provider Notes (Signed)
[facility] Emergency Department Provider Note   ____________________________________________   First MD Initiated Contact with Patient 04/25/19 [phone]     (approximate)  I have reviewed the triage vital signs and the nursing notes.   HISTORY  Chief Complaint Abscess   HPI Catherine Castro is a 33 y.o. female presents with complaint of area on the right lateral rib area near the edge of her bra line.  Patient denies any injury.  She states that she has been using ibuprofen and heating pad without any relief.  Patient states that it is so painful that she has been unable to wear her bra.  She rates her pain as an 8 out of 10.       No past medical history on file.  There are no active problems to display for this patient.   No past surgical history on file.  Prior to Admission medications   Medication Sig Start Date End Date Taking? Authorizing Provider  sulfamethoxazole-trimethoprim (BACTRIM DS) 800-160 MG tablet Take 1 tablet by mouth 2 (two) times daily. 04/25/19   Johnn Hai, PA-C  traMADol (ULTRAM) 50 MG tablet Take 1 tablet (50 mg total) by mouth every 6 (six) hours as needed. 04/25/19   Johnn Hai, PA-C    Allergies Penicillins  No family history on file.  Social History Social History   Tobacco Use  . Smoking status: Former Research scientist (life sciences)  . Smokeless tobacco: Never Used  Substance Use Topics  . Alcohol use: Not Currently  . Drug use: Not on file    Review of Systems Constitutional: No fever/chills Cardiovascular: Denies chest pain. Respiratory: Denies shortness of breath. Musculoskeletal: Negative for back pain. Skin: Questionable abscess. Neurological: Negative for focal weakness or numbness. ___________________________________________   PHYSICAL EXAM:  VITAL SIGNS: ED Triage Vitals  Enc Vitals Group     BP 04/25/19 0634 137/90     Pulse Rate 04/25/19 0634 90     Resp 04/25/19 0634 16     Temp 04/25/19 0634 99 F  (37.2 C)     Temp Source 04/25/19 0634 Oral     SpO2 04/25/19 0634 100 %     Weight 04/25/19 0633 280 lb (127 kg)     Height 04/25/19 0633 5\' 1"  (1.549 m)     Head Circumference --      Peak Flow --      Pain Score 04/25/19 0633 8     Pain Loc --      Pain Edu? --      Excl. in [place]? --     Constitutional: Alert and oriented. Well appearing and in no acute distress. Eyes: Conjunctivae are normal.  Head: Atraumatic. Neck: No stridor.   Cardiovascular: Normal rate, regular rhythm. Grossly normal heart sounds.  Good peripheral circulation. Respiratory: Normal respiratory effort.  No retractions. Lungs CTAB. Neurologic:  Normal speech and language. No gross focal neurologic deficits are appreciated. No gait instability. Skin:  Skin is warm, dry and intact.  Erythematous and markedly tender to centimeter area to the right lateral rib area just below what would be the edge of her bra.  Skin is intact and no fluctuant areas noted.  No other lesions are noted in this area. Psychiatric: Mood and affect are normal. Speech and behavior are normal.  ____________________________________________   LABS (all labs ordered are listed, but only abnormal results are displayed)  Labs Reviewed - No data to display   PROCEDURES  Procedure(s) performed (including  Critical Care):  Procedures   ____________________________________________   INITIAL IMPRESSION / ASSESSMENT AND PLAN / ED COURSE  As part of my medical decision making, I reviewed the following data within the electronic MEDICAL RECORD NUMBER Notes from prior ED visits and [place] Controlled Substance Database  33 year old female presents to the ED with red markedly tender area to her right lateral ribs.  Is unaware of any insect bites to this area.  Patient denies any fever or chills.  Area is firm.  Patient is encouraged to use warm moist compresses to the area frequently.  She was placed on Bactrim DS twice daily for 10 days and tramadol  every 6 hours as needed for pain.  She is to return to the ED if any softening in this area or not improving.  ____________________________________________   FINAL CLINICAL IMPRESSION(S) / ED DIAGNOSES  Final diagnoses:  Cellulitis of chest wall     ED Discharge Orders         Ordered    sulfamethoxazole-trimethoprim (BACTRIM DS) 800-160 MG tablet  2 times daily     04/25/19 0721    traMADol (ULTRAM) 50 MG tablet  Every 6 hours PRN     04/25/19 6314           Note:  This document was prepared using Dragon voice recognition software and may include unintentional dictation errors.    Tommi Rumps, PA-C 04/25/19 1050    Chesley Noon, MD 04/26/19 [phone]

## 2019-04-25 NOTE — ED Triage Notes (Signed)
Pt with abscess to right upper lateral back. Pt states "it's been brewing for a few days". Area of redness approx 4cm noted to area.

## 2019-04-25 NOTE — Discharge Instructions (Addendum)
Follow-up with your primary care provider if any continued problems.  Return to the emergency department if any area is larger or soft so that it can be opened and drained.  Continue using warm compresses to the area frequently.  Begin taking antibiotics twice a day for the next 10 days.  Tramadol is for pain.  Do not drive or operate machinery while taking this medication.

## 2019-04-25 NOTE — ED Notes (Signed)
See triage note  Presents with a rash area to right lateral rib area  States she felt like it is an abscess    Has been using IBU and heat w/o relief.

## 2019-04-29 ENCOUNTER — Emergency Department: Payer: Self-pay

## 2019-04-29 ENCOUNTER — Other Ambulatory Visit: Payer: Self-pay

## 2019-04-29 ENCOUNTER — Emergency Department
Admission: EM | Admit: 2019-04-29 | Discharge: 2019-04-29 | Disposition: A | Discharge status: Home / Self Care | Payer: Self-pay | Attending: Emergency Medicine | Admitting: Emergency Medicine

## 2019-04-29 DIAGNOSIS — U071 COVID-19: Secondary | ICD-10-CM | POA: Insufficient documentation

## 2019-04-29 DIAGNOSIS — L0291 Cutaneous abscess, unspecified: Secondary | ICD-10-CM

## 2019-04-29 DIAGNOSIS — Z87891 Personal history of nicotine dependence: Secondary | ICD-10-CM | POA: Insufficient documentation

## 2019-04-29 DIAGNOSIS — L02213 Cutaneous abscess of chest wall: Secondary | ICD-10-CM | POA: Insufficient documentation

## 2019-04-29 LAB — CBC
HCT: 37.9 % (ref 36.0–46.0)
Hemoglobin: 13.1 g/dL (ref 12.0–15.0)
MCH: 28.4 pg (ref 26.0–34.0)
MCHC: 34.6 g/dL (ref 30.0–36.0)
MCV: 82 fL (ref 80.0–100.0)
Platelets: 349 10*3/uL (ref 150–400)
RBC: 4.62 MIL/uL (ref 3.87–5.11)
RDW: 13.8 % (ref 11.5–15.5)
WBC: 7.5 10*3/uL (ref 4.0–10.5)
nRBC: 0 % (ref 0.0–0.2)

## 2019-04-29 LAB — BASIC METABOLIC PANEL
Anion gap: 9 (ref 5–15)
BUN: 12 mg/dL (ref 6–20)
CO2: 21 mmol/L — ABNORMAL LOW (ref 22–32)
Calcium: 8.8 mg/dL — ABNORMAL LOW (ref 8.9–10.3)
Chloride: 106 mmol/L (ref 98–111)
Creatinine, Ser: 0.73 mg/dL (ref 0.44–1.00)
GFR calc Af Amer: >60 mL/min (ref >60)
GFR calc non Af Amer: >60 mL/min (ref >60)
Glucose, Bld: 99 mg/dL (ref 70–99)
Potassium: 4.2 mmol/L (ref 3.5–5.1)
Sodium: 136 mmol/L (ref 135–145)

## 2019-04-29 LAB — TROPONIN I (HIGH SENSITIVITY): Troponin I (High Sensitivity): 3 ng/L (ref <18)

## 2019-04-29 MED ORDER — OXYCODONE-ACETAMINOPHEN 5-325 MG PO TABS
1.0000 | ORAL_TABLET | Freq: Once | ORAL | Status: AC
  Administered 2019-04-29: 1 via ORAL
  Filled 2019-04-29: qty 1

## 2019-04-29 MED ORDER — LIDOCAINE HCL (PF) 1 % IJ SOLN: 10.0000 mL | Freq: Once | INTRAMUSCULAR | Status: AC

## 2019-04-29 MED ORDER — OXYCODONE-ACETAMINOPHEN 5-325 MG PO TABS: 1.0000 | ORAL_TABLET | Freq: Four times a day (QID) | ORAL | 0 refills | Status: AC | PRN

## 2019-04-29 MED ORDER — OXYCODONE-ACETAMINOPHEN 5-325 MG PO TABS: 1.0000 | ORAL_TABLET | Freq: Four times a day (QID) | ORAL | 0 refills | Status: DC | PRN

## 2019-04-29 MED ORDER — LIDOCAINE-PRILOCAINE 2.5-2.5 % EX CREA
TOPICAL_CREAM | Freq: Once | CUTANEOUS | Status: AC
  Administered 2019-04-29: 1 via TOPICAL
  Filled 2019-04-29: qty 5

## 2019-04-29 MED ORDER — LIDOCAINE HCL (PF) 1 % IJ SOLN
INTRAMUSCULAR | Status: AC
  Administered 2019-04-29: 10 mL via INTRADERMAL
  Filled 2019-04-29: qty 10

## 2019-04-29 NOTE — ED Notes (Signed)
Pt has abscess on back in upper right area. Abscess is closed but has two raised areas and is warm to the touch. Tender upon palpation.

## 2019-04-29 NOTE — ED Triage Notes (Signed)
Pt states that she was diagnosed with covid on Wednesday, pt states that she has been vomiting with her coughing and now her chest is hurting from coughing. Pt also states a major concern is the abscess that she was seen for and given antibiotics for hasn't gotten better

## 2019-04-29 NOTE — ED Provider Notes (Signed)
[facility] Emergency Department Provider Note ____________________________________________   First MD Initiated Contact with Patient 04/29/19 [id number]     (approximate)  I have reviewed the triage vital signs and the nursing notes.   HISTORY  Chief Complaint Chest Pain and Abscess    HPI Catherine RooksRobin L Castro is a [age] y.o. female with no significant PMH who presents primarily for an abscess to the right lateral chest wall, gradual onset over the last week, and associated with worsening pain.  The patient was seen in the ED 4 days ago for this and started on antibiotics but states that the abscess has grown in size and become more painful.  She denies associated fever or chills.  The patient states that she had some vomiting 2 days ago and was diagnosed with COVID-19.  She has had nonproductive cough but no shortness of breath.  She reports some vomiting and decreased p.o. intake, but states that he is she is able to drink water and is urinating a normal frequency.  No past medical history on file.  There are no problems to display for this patient.   No past surgical history on file.  Prior to Admission medications   Medication Sig Start Date End Date Taking? Authorizing Provider  oxyCODONE-acetaminophen (PERCOCET) 5-325 MG tablet Take 1 tablet by mouth every 6 (six) hours as needed for up to 5 days for severe pain. 04/29/19 05/04/19  Dionne BucySiadecki, Kail Fraley, MD  sulfamethoxazole-trimethoprim (BACTRIM DS) 800-160 MG tablet Take 1 tablet by mouth 2 (two) times daily. 04/25/19   Tommi RumpsSummers, Rhonda L, PA-C  traMADol (ULTRAM) 50 MG tablet Take 1 tablet (50 mg total) by mouth every 6 (six) hours as needed. 04/25/19   Tommi RumpsSummers, Rhonda L, PA-C    Allergies Penicillins  No family history on file.  Social History Social History   Tobacco Use  . Smoking status: Former Games developermoker  . Smokeless tobacco: Never Used  Substance Use Topics  . Alcohol use: Not Currently  . Drug use: Not  on file    Review of Systems  Constitutional: No fever. Eyes: No redness. ENT: No sore throat. Cardiovascular: Positive for chest pain with cough. Respiratory: Denies shortness of breath. Gastrointestinal: Positive for vomiting. Genitourinary: Negative for dysuria.  Musculoskeletal: Negative for back pain. Skin: Negative for rash. Neurological: Negative for headache.   ____________________________________________   PHYSICAL EXAM:  VITAL SIGNS: ED Triage Vitals [04/29/19 0812]  Enc Vitals Group     BP (!) 144/92     Pulse Rate (!) 108     Resp 18     Temp 99.6 F (37.6 C)     Temp Source Oral     SpO2 100 %     Weight      Height      Head Circumference      Peak Flow      Pain Score 9     Pain Loc      Pain Edu?      Excl. in [place]?     Constitutional: Alert and oriented.  Relatively well appearing and in no acute distress. Eyes: Conjunctivae are normal.  Head: Atraumatic. Nose: No congestion/rhinnorhea. Mouth/Throat: Mucous membranes are moist.   Neck: Normal range of motion.  Cardiovascular: Normal rate, regular rhythm. Good peripheral circulation. Respiratory: Normal respiratory effort.  No retractions.  Gastrointestinal:  No distention.  Musculoskeletal: Extremities warm and well perfused.  Neurologic:  Normal speech and language. No gross focal neurologic deficits are appreciated.  Skin:  Skin is warm and dry. No rash noted.  Right lateral chest wall with approximately 3 x 10 cm area of induration, warmth, and with approximately 3 x 5 cm area of fluctuance.  No open wound or drainage. Psychiatric: Mood and affect are normal. Speech and behavior are normal.  ____________________________________________   LABS (all labs ordered are listed, but only abnormal results are displayed)  Labs Reviewed  BASIC METABOLIC PANEL - Abnormal; Notable for the following components:      Result Value   CO2 21 (*)    Calcium 8.8 (*)    All other components within  normal limits  CBC  POC URINE PREG, ED  TROPONIN I (HIGH SENSITIVITY)   ____________________________________________  EKG  ED ECG REPORT I, Arta Silence, the attending physician, personally viewed and interpreted this ECG.  Date: 04/29/2019 EKG Time: 0805 Rate: 107 Rhythm: normal sinus rhythm QRS Axis: Right axis Intervals: normal ST/T Wave abnormalities: normal Narrative Interpretation: no evidence of acute ischemia  ____________________________________________  RADIOLOGY  CXR: No focal infiltrate.  Mild central airway thickening.  ____________________________________________   PROCEDURES  Procedure(s) performed: Yes  .Marland KitchenIncision and Drainage  Date/Time: 04/29/2019 12:35 PM Performed by: Arta Silence, MD Authorized by: Arta Silence, MD   Consent:    Consent obtained:  Verbal   Consent given by:  Patient   Risks discussed:  Bleeding, infection, incomplete drainage and pain   Alternatives discussed:  Alternative treatment, delayed treatment and observation Location:    Type:  Abscess   Size:  5cm   Location:  Trunk   Trunk location:  Chest Pre-procedure details:    Skin preparation:  Chloraprep Anesthesia (see MAR for exact dosages):    Anesthesia method:  Local infiltration and topical application   Topical anesthetic:  EMLA cream   Local anesthetic:  Lidocaine 1% w/o epi Procedure type:    Complexity:  Simple Procedure details:    Incision types:  Single straight   Incision depth:  Dermal   Scalpel blade:  11   Drainage:  Purulent   Drainage amount:  Copious   Wound treatment:  Wound left open   Packing materials:  None Post-procedure details:    Patient tolerance of procedure:  Tolerated well, no immediate complications    Critical Care performed: No ____________________________________________   INITIAL IMPRESSION / ASSESSMENT AND PLAN / ED COURSE  Pertinent labs & imaging results that were available during my care of  the patient were reviewed by me and considered in my medical decision making (see chart for details).  33 year old female with no significant PMH presents with worsening pain and swelling from an abscess to her right lateral chest wall over the last several days.  In addition she was diagnosed with COVID-19 2 days ago and reports nonproductive cough and vomiting.  She has chest pain with the cough, but denies any active chest pain at this time.  She has no shortness of breath.  I reviewed the past medical records in [place].  The patient was seen in the ED on 12/7 and the decision was made at that time to not I&D.  She was started on Bactrim.  On exam the patient is overall well-appearing and her vital signs are normal except for borderline elevated temperature.  She has no increased work of breathing or respiratory distress and is speaking in full sentences.  She has an area to the right lateral chest wall with some fluctuance consistent with abscess, and I confirmed a fluid collection on  bedside ultrasound.  In terms of the COVID-19, the chest x-ray is clear, the patient has no significant increased work of breathing and is oxygenating normally.  Her lab work-up is unremarkable.  Her EKG and troponin are both within normal limits.  There is no evidence of cardiac etiology of the pain.  She has some vomiting but is able to tolerate p.o. liquids and does not appear significantly dehydrated.  The abscess is not improving with conservative treatment and will require I&D.  ----------------------------------------- 12:36 PM on 04/29/2019 -----------------------------------------  The abscess was I&D'ed successfully with a return of copious pus.  The patient is stable for discharge home.  I instructed her to continue the antibiotics that she was prescribed on her last visit, and to continue over-the-counter medications for fever and cough related to the COVID-19.  I gave her very thorough return precautions  both for the abscess and COVID-19, and she expressed understanding.    ___________________________  Catherine Castro was evaluated in Emergency Department on 04/29/2019 for the symptoms described in the history of present illness. She was evaluated in the context of the global COVID-19 pandemic, which necessitated consideration that the patient might be at risk for infection with the SARS-CoV-2 virus that causes COVID-19. Institutional protocols and algorithms that pertain to the evaluation of patients at risk for COVID-19 are in a state of rapid change based on information released by regulatory bodies including the CDC and federal and state organizations. These policies and algorithms were followed during the patient's care in the ED.  ____________________________________________   FINAL CLINICAL IMPRESSION(S) / ED DIAGNOSES  Final diagnoses:  Abscess  COVID-19      NEW MEDICATIONS STARTED DURING THIS VISIT:  New Prescriptions   OXYCODONE-ACETAMINOPHEN (PERCOCET) 5-325 MG TABLET    Take 1 tablet by mouth every 6 (six) hours as needed for up to 5 days for severe pain.     Note:  This document was prepared using Dragon voice recognition software and may include unintentional dictation errors.    Dionne Bucy, MD 04/29/19 1236

## 2019-04-29 NOTE — Discharge Instructions (Signed)
Continue to take the antibiotics that you were prescribed and finish the full course.  You may take the Percocet prescribed today for pain (do not take it at the same time as the tramadol).  Continue over-the-counter Tylenol and/or ibuprofen for fever and pain, and over-the-counter Robitussin or Tessalon Perles for cough.  Return to the ER for new, worsening, or persistent severe swelling of the abscess, bleeding or continued pus drainage, high fevers, weakness, or worsening shortness of breath.

## 2019-07-19 ENCOUNTER — Other Ambulatory Visit: Payer: Self-pay

## 2019-07-19 ENCOUNTER — Encounter: Payer: Self-pay | Admitting: Emergency Medicine

## 2019-07-19 ENCOUNTER — Emergency Department
Admission: EM | Admit: 2019-07-19 | Discharge: 2019-07-19 | Disposition: A | Discharge status: Home / Self Care | Payer: Self-pay | Attending: Emergency Medicine | Admitting: Emergency Medicine

## 2019-07-19 DIAGNOSIS — L0291 Cutaneous abscess, unspecified: Secondary | ICD-10-CM

## 2019-07-19 DIAGNOSIS — L02211 Cutaneous abscess of abdominal wall: Secondary | ICD-10-CM | POA: Insufficient documentation

## 2019-07-19 DIAGNOSIS — Z87891 Personal history of nicotine dependence: Secondary | ICD-10-CM | POA: Insufficient documentation

## 2019-07-19 MED ORDER — OXYCODONE-ACETAMINOPHEN 7.5-325 MG PO TABS: 1.0000 | ORAL_TABLET | Freq: Four times a day (QID) | ORAL | 0 refills | Status: DC | PRN

## 2019-07-19 MED ORDER — LIDOCAINE HCL (PF) 1 % IJ SOLN
5.0000 mL | Freq: Once | INTRAMUSCULAR | Status: AC
  Administered 2019-07-19: 5 mL
  Filled 2019-07-19: qty 5

## 2019-07-19 MED ORDER — LIDOCAINE-EPINEPHRINE-TETRACAINE (LET) TOPICAL GEL
3.0000 mL | Freq: Once | TOPICAL | Status: AC
  Administered 2019-07-19: 3 mL via TOPICAL
  Filled 2019-07-19: qty 3

## 2019-07-19 MED ORDER — SULFAMETHOXAZOLE-TRIMETHOPRIM 800-160 MG PO TABS: 1.0000 | ORAL_TABLET | Freq: Two times a day (BID) | ORAL | 0 refills | Status: DC

## 2019-07-19 NOTE — ED Triage Notes (Signed)
C/O abscess to right side x 4 days.

## 2019-07-19 NOTE — ED Provider Notes (Signed)
[facility] Emergency Department Provider Note   ____________________________________________   First MD Initiated Contact with Patient 07/19/19 0802     (approximate)  I have reviewed the triage vital signs and the nursing notes.   HISTORY  Chief Complaint Abscess    HPI Catherine Castro is a 34 y.o. female patient complain abscess to the right flank area for 4 days.  Patient is to increase in size of all drainage.  Denies fever associated complaint.  Rates pain as a 9/10.  Described pain as "achy".  No palliative measure for complaint.         History reviewed. No pertinent past medical history.  There are no problems to display for this patient.   History reviewed. No pertinent surgical history.  Prior to Admission medications   Medication Sig Start Date End Date Taking? Authorizing Provider  oxyCODONE-acetaminophen (PERCOCET) 7.5-325 MG tablet Take 1 tablet by mouth every 6 (six) hours as needed for severe pain. 07/19/19   Sable Feil, PA-C  sulfamethoxazole-trimethoprim (BACTRIM DS) 800-160 MG tablet Take 1 tablet by mouth 2 (two) times daily. 07/19/19   Sable Feil, PA-C    Allergies Penicillins and Shellfish allergy  No family history on file.  Social History Social History   Tobacco Use  . Smoking status: Former Research scientist (life sciences)  . Smokeless tobacco: Never Used  Substance Use Topics  . Alcohol use: Not Currently  . Drug use: Not on file    Review of Systems Constitutional: No fever/chills Eyes: No visual changes. ENT: No sore throat. Cardiovascular: Denies chest pain. Respiratory: Denies shortness of breath. Gastrointestinal: No abdominal pain.  No nausea, no vomiting.  No diarrhea.  No constipation. Genitourinary: Negative for dysuria. Musculoskeletal: Negative for back pain. Skin: Negative for rash.  Nodule lesion right flank area. Neurological: Negative for headaches, focal weakness or numbness. Allergic/Immunilogical:  Penicillin and shellfish. ____________________________________________   PHYSICAL EXAM:  VITAL SIGNS: ED Triage Vitals  Enc Vitals Group     BP 07/19/19 0753 133/86     Pulse Rate 07/19/19 0753 92     Resp 07/19/19 0753 16     Temp 07/19/19 0753 98.3 F (36.8 C)     Temp Source 07/19/19 0753 Oral     SpO2 07/19/19 0753 99 %     Weight 07/19/19 0754 279 lb 15.8 oz (127 kg)     Height 07/19/19 0754 5\' 1"  (1.549 m)     Head Circumference --      Peak Flow --      Pain Score 07/19/19 0753 9     Pain Loc --      Pain Edu? --      Excl. in [place]? --     Constitutional: Alert and oriented. Well appearing and in no acute distress.  Morbid obesity. Cardiovascular: Normal rate, regular rhythm. Grossly normal heart sounds.  Good peripheral circulation. Respiratory: Normal respiratory effort.  No retractions. Lungs CTAB. Skin:  Skin is warm, dry and intact.  Nodule lesion on erythematous base. Psychiatric: Mood and affect are normal. Speech and behavior are normal.  ____________________________________________   LABS (all labs ordered are listed, but only abnormal results are displayed)  Labs Reviewed - No data to display ____________________________________________  EKG   ____________________________________________  RADIOLOGY  ED MD interpretation:    Official radiology report(s): No results found.  ____________________________________________   PROCEDURES  Procedure(s) performed (including Critical Care):  Marland KitchenMarland KitchenIncision and Drainage  Date/Time: 07/19/2019 8:52 AM Performed by: Tamala Julian,  Arther Abbott, PA-C Authorized by: Joni Reining, PA-C   Consent:    Consent obtained:  Verbal   Consent given by:  Patient   Risks discussed:  Bleeding, incomplete drainage and pain Location:    Type:  Abscess   Location:  Trunk   Trunk location:  Back Pre-procedure details:    Skin preparation:  Betadine Anesthesia (see MAR for exact dosages):    Anesthesia method:  Topical  application and local infiltration   Topical anesthetic:  LET   Local anesthetic:  Lidocaine 1% w/o epi Procedure type:    Complexity:  Simple Procedure details:    Incision types:  Stab incision and single straight   Incision depth:  Subcutaneous   Scalpel blade:  11   Wound management:  Probed and deloculated   Drainage:  Purulent   Drainage amount:  Moderate   Wound treatment:  Wound left open   Packing materials:  None Post-procedure details:    Patient tolerance of procedure:  Tolerated well, no immediate complications     ____________________________________________   INITIAL IMPRESSION / ASSESSMENT AND PLAN / ED COURSE  As part of my medical decision making, I reviewed the following data within the electronic MEDICAL RECORD NUMBER     Patient presents with abscess to the right flank area.  See procedure note for incision and drainage.  Patient given discharge care instruction work note.  Patient advised take medication as directed.  Patient advised follow-up PCP.    Catherine Castro was evaluated in Emergency Department on 07/19/2019 for the symptoms described in the history of present illness. She was evaluated in the context of the global COVID-19 pandemic, which necessitated consideration that the patient might be at risk for infection with the SARS-CoV-2 virus that causes COVID-19. Institutional protocols and algorithms that pertain to the evaluation of patients at risk for COVID-19 are in a state of rapid change based on information released by regulatory bodies including the CDC and federal and state organizations. These policies and algorithms were followed during the patient's care in the ED.       ____________________________________________   FINAL CLINICAL IMPRESSION(S) / ED DIAGNOSES  Final diagnoses:  Abscess     ED Discharge Orders         Ordered    sulfamethoxazole-trimethoprim (BACTRIM DS) 800-160 MG tablet  2 times daily     07/19/19 0850     oxyCODONE-acetaminophen (PERCOCET) 7.5-325 MG tablet  Every 6 hours PRN     07/19/19 0850           Note:  This document was prepared using Dragon voice recognition software and may include unintentional dictation errors.    Joni Reining, PA-C 07/19/19 6045    Sharman Cheek, MD 07/19/19 [phone]

## 2019-07-19 NOTE — Discharge Instructions (Signed)
Discharge care instruction take medication as directed. 

## 2019-07-19 NOTE — ED Notes (Signed)
See triage note  Presents with possible abscess area at bra line on right ..states she noticed area about 4-5 days ago   Area became large yesterday

## 2019-10-01 ENCOUNTER — Encounter: Payer: Self-pay | Admitting: Emergency Medicine

## 2019-10-01 ENCOUNTER — Other Ambulatory Visit: Payer: Self-pay

## 2019-10-01 ENCOUNTER — Emergency Department
Admission: EM | Admit: 2019-10-01 | Discharge: 2019-10-01 | Disposition: A | Discharge status: Home / Self Care | Payer: Self-pay | Attending: Emergency Medicine | Admitting: Emergency Medicine

## 2019-10-01 DIAGNOSIS — L03319 Cellulitis of trunk, unspecified: Secondary | ICD-10-CM | POA: Insufficient documentation

## 2019-10-01 DIAGNOSIS — Z87891 Personal history of nicotine dependence: Secondary | ICD-10-CM | POA: Insufficient documentation

## 2019-10-01 DIAGNOSIS — L02219 Cutaneous abscess of trunk, unspecified: Secondary | ICD-10-CM | POA: Insufficient documentation

## 2019-10-01 MED ORDER — OXYCODONE-ACETAMINOPHEN 5-325 MG PO TABS
1.0000 | ORAL_TABLET | Freq: Once | ORAL | Status: AC
  Administered 2019-10-01: 1 via ORAL
  Filled 2019-10-01: qty 1

## 2019-10-01 MED ORDER — CEFTRIAXONE SODIUM 1 G IJ SOLR
1.0000 g | Freq: Once | INTRAMUSCULAR | Status: AC
  Administered 2019-10-01: 1 g via INTRAMUSCULAR
  Filled 2019-10-01: qty 10

## 2019-10-01 MED ORDER — SULFAMETHOXAZOLE-TRIMETHOPRIM 800-160 MG PO TABS: 1.0000 | ORAL_TABLET | Freq: Two times a day (BID) | ORAL | 0 refills | Status: DC

## 2019-10-01 MED ORDER — OXYCODONE-ACETAMINOPHEN 5-325 MG PO TABS: 1.0000 | ORAL_TABLET | ORAL | 0 refills | Status: DC | PRN

## 2019-10-01 MED ORDER — LIDOCAINE HCL (PF) 1 % IJ SOLN
5.0000 mL | Freq: Once | INTRAMUSCULAR | Status: AC
  Administered 2019-10-01: 5 mL via INTRADERMAL
  Filled 2019-10-01: qty 5

## 2019-10-01 NOTE — Discharge Instructions (Signed)
Continue to apply warm compresses.  Take the antibiotic and pain medication as prescribed.  Can also take ibuprofen for pain.  Return to the emergency department if the area is enlarging or worsening.

## 2019-10-01 NOTE — ED Notes (Signed)
Pain under right breast, patient report no drainage just swelling and pain x 2 days.

## 2019-10-01 NOTE — ED Triage Notes (Signed)
Pt presents to ED via POV with c/o abscess under R breast. Pt states has been there x several days. Pt states has been putting warm compresses on it and it has grown in size and increased in pain.

## 2019-10-01 NOTE — ED Provider Notes (Signed)
[facility] Emergency Department Provider Note  ____________________________________________   First MD Initiated Contact with Patient 10/01/19 1020     (approximate)  I have reviewed the triage vital signs and the nursing notes.   HISTORY  Chief Complaint Abscess    HPI Catherine Castro is a 34 y.o. female presents emergency department complaint of a abscess under her right breast.  Symptoms for several days.  She has history of multiple cyst and abscesses.  She states her own breast keeps rubbing against abscesses draining more pain.  She has had no known fever but did feel warm last night.  No vomiting.    History reviewed. No pertinent past medical history.  There are no problems to display for this patient.   History reviewed. No pertinent surgical history.  Prior to Admission medications   Medication Sig Start Date End Date Taking? Authorizing Provider  oxyCODONE-acetaminophen (PERCOCET) 5-325 MG tablet Take 1 tablet by mouth every 4 (four) hours as needed for severe pain. 10/01/19 09/30/20  Fisher, Linden Dolin, PA-C  sulfamethoxazole-trimethoprim (BACTRIM DS) 800-160 MG tablet Take 1 tablet by mouth 2 (two) times daily. 10/01/19   Versie Starks, PA-C    Allergies Penicillins and Shellfish allergy  No family history on file.  Social History Social History   Tobacco Use  . Smoking status: Former Research scientist (life sciences)  . Smokeless tobacco: Never Used  Substance Use Topics  . Alcohol use: Not Currently  . Drug use: Not on file    Review of Systems  Constitutional: No fever/chills Eyes: No visual changes. ENT: No sore throat. Respiratory: Denies cough Cardiovascular: Denies chest pain Gastrointestinal: Denies abdominal pain Genitourinary: Negative for dysuria. Musculoskeletal: Negative for back pain. Skin: Negative for rash.  Positive for abscess Psychiatric: no mood changes,     ____________________________________________   PHYSICAL EXAM:   VITAL SIGNS: ED Triage Vitals [10/01/19 0930]  Enc Vitals Group     BP 133/81     Pulse Rate 76     Resp (!) 22     Temp 98 F (36.7 C)     Temp Source Oral     SpO2 99 %     Weight 261 lb (118.4 kg)     Height 5\' 1"  (1.549 m)     Head Circumference      Peak Flow      Pain Score 8     Pain Loc      Pain Edu?      Excl. in [place]?     Constitutional: Alert and oriented. Well appearing and in no acute distress. Eyes: Conjunctivae are normal.  Head: Atraumatic. Nose: No congestion/rhinnorhea. Mouth/Throat: Mucous membranes are moist.   Neck:  supple no lymphadenopathy noted Cardiovascular: Normal rate, regular rhythm. Respiratory: Normal respiratory effort.  No retractions,  Breast: Area of right beneath the right breast has a hard indurated area but no fluctuance, small pustule noted at the area close to the sternum.  Area is not fluctuant. GU: deferred Musculoskeletal: FROM all extremities, warm and well perfused Neurologic:  Normal speech and language.  Skin:  Skin is warm, dry and intact.  See breast Psychiatric: Mood and affect are normal. Speech and behavior are normal.  ____________________________________________   LABS (all labs ordered are listed, but only abnormal results are displayed)  Labs Reviewed - No data to display ____________________________________________   ____________________________________________  RADIOLOGY    ____________________________________________   PROCEDURES  Procedure(s) performed: Rocephin 1 g IM, Percocet 1 p.o.  Procedures    ____________________________________________   INITIAL IMPRESSION / ASSESSMENT AND PLAN / ED COURSE  Pertinent labs & imaging results that were available during my care of the patient were reviewed by me and considered in my medical decision making (see chart for details).   Patient is a 34 year old female presents emergency department complaining of an abscess underneath the right breast.   See HPI  Physical exam shows patient to appear well.  Right breast is nontender but there is an area below the right breast which is hard indurated.  No fluctuance is noted.  Explained the findings to the patient.  Splane to her is not quite time to lance the abscess.  Feel we will do a trial of antibiotics along with warm compresses and pain medication.  If she is worsening she is to return to the emergency department.  We discussed her penicillin allergy she said it was only as a baby.  She has not had any other reactions to antibiotics since then.  I feel that it would be safe to give her an injection of Rocephin 1 g IM and then start her on Bactrim along with Percocet for pain.  She states she understands.  She will be discharged in stable condition    Catherine Castro was evaluated in Emergency Department on 10/01/2019 for the symptoms described in the history of present illness. She was evaluated in the context of the global COVID-19 pandemic, which necessitated consideration that the patient might be at risk for infection with the SARS-CoV-2 virus that causes COVID-19. Institutional protocols and algorithms that pertain to the evaluation of patients at risk for COVID-19 are in a state of rapid change based on information released by regulatory bodies including the CDC and federal and state organizations. These policies and algorithms were followed during the patient's care in the ED.   As part of my medical decision making, I reviewed the following data within the electronic MEDICAL RECORD NUMBER Nursing notes reviewed and incorporated, Old chart reviewed, Notes from prior ED visits and [place] Controlled Substance Database  ____________________________________________   FINAL CLINICAL IMPRESSION(S) / ED DIAGNOSES  Final diagnoses:  Cellulitis and abscess of trunk      NEW MEDICATIONS STARTED DURING THIS VISIT:  New Prescriptions   OXYCODONE-ACETAMINOPHEN (PERCOCET) 5-325 MG TABLET    Take 1  tablet by mouth every 4 (four) hours as needed for severe pain.   SULFAMETHOXAZOLE-TRIMETHOPRIM (BACTRIM DS) 800-160 MG TABLET    Take 1 tablet by mouth 2 (two) times daily.     Note:  This document was prepared using Dragon voice recognition software and may include unintentional dictation errors.    Faythe Ghee, PA-C 10/01/19 1051    Jene Every, MD 10/01/19 1145

## 2019-12-10 ENCOUNTER — Encounter: Payer: Self-pay | Admitting: Emergency Medicine

## 2019-12-10 ENCOUNTER — Emergency Department
Admission: EM | Admit: 2019-12-10 | Discharge: 2019-12-10 | Disposition: A | Discharge status: Home / Self Care | Payer: Self-pay | Attending: Emergency Medicine | Admitting: Emergency Medicine

## 2019-12-10 ENCOUNTER — Other Ambulatory Visit: Payer: Self-pay

## 2019-12-10 DIAGNOSIS — Z87891 Personal history of nicotine dependence: Secondary | ICD-10-CM | POA: Insufficient documentation

## 2019-12-10 DIAGNOSIS — L0501 Pilonidal cyst with abscess: Secondary | ICD-10-CM | POA: Insufficient documentation

## 2019-12-10 MED ORDER — SULFAMETHOXAZOLE-TRIMETHOPRIM 800-160 MG PO TABS: 1.0000 | ORAL_TABLET | Freq: Two times a day (BID) | ORAL | 0 refills | Status: DC

## 2019-12-10 MED ORDER — OXYCODONE-ACETAMINOPHEN 5-325 MG PO TABS: 1.0000 | ORAL_TABLET | Freq: Four times a day (QID) | ORAL | 0 refills | Status: DC | PRN

## 2019-12-10 NOTE — ED Notes (Signed)
First Nurse Note: Pt ambulatory into lobby for c/o abscess on her tailbone. Pt is in NAD.

## 2019-12-10 NOTE — ED Triage Notes (Signed)
Pt c/o abscess x 3 days at this time, pt states "It's at the end of my crack". Pt states pain worse with sitting. Pt states hx of abscesses at this time.

## 2019-12-10 NOTE — Discharge Instructions (Signed)
Begin taking antibiotic and pain medication as directed.  Also increase the warm compresses or get in a tub of warm water using a plastic ring as we discussed to sit on while you are in the tub.  When the area becomes soft we will be able to open it up and drain this area.  Be aware that the pain medication could cause drowsiness and increase your risk for injury.  Do not drive while taking this medication.

## 2019-12-10 NOTE — ED Notes (Signed)
See triage note. Pt here with abscess to medial buttocks area that started 3 days ago.

## 2019-12-10 NOTE — ED Provider Notes (Signed)
[facility] Emergency Department Provider Note  ____________________________________________   First MD Initiated Contact with Patient 12/10/19 [phone]     (approximate)  I have reviewed the triage vital signs and the nursing notes.   HISTORY  Chief Complaint Abscess   HPI Catherine Castro is a 34 y.o. female presents to the ED with complaint of an abscess that started approximately 3 days ago.  Patient states that it is in the "area of her tailbone".  Patient states she has had abscesses before.  She reports that this 1 is "hard".  She rates her pain as a 10/10.      History reviewed. No pertinent past medical history.  There are no problems to display for this patient.   History reviewed. No pertinent surgical history.  Prior to Admission medications   Medication Sig Start Date End Date Taking? Authorizing Provider  oxyCODONE-acetaminophen (PERCOCET) 5-325 MG tablet Take 1 tablet by mouth every 6 (six) hours as needed for severe pain. 12/10/19   Tommi Rumps, PA-C  sulfamethoxazole-trimethoprim (BACTRIM DS) 800-160 MG tablet Take 1 tablet by mouth 2 (two) times daily. 12/10/19   Tommi Rumps, PA-C    Allergies Penicillins and Shellfish allergy  History reviewed. No pertinent family history.  Social History Social History   Tobacco Use  . Smoking status: Former Games developer  . Smokeless tobacco: Never Used  Substance Use Topics  . Alcohol use: Not Currently  . Drug use: Not on file    Review of Systems Constitutional: No fever/chills Eyes: No visual changes. Cardiovascular: Denies chest pain. Respiratory: Denies shortness of breath. Gastrointestinal: No abdominal pain.  No nausea, no vomiting.   Musculoskeletal: Negative for muscle skeletal pain. Skin: Positive for questionable abscess. Neurological: Negative for headaches, focal weakness or numbness. ____________________________________________   PHYSICAL EXAM:  VITAL SIGNS: ED  Triage Vitals  Enc Vitals Group     BP 12/10/19 0746 (!) 145/94     Pulse Rate 12/10/19 0746 87     Resp 12/10/19 0746 20     Temp 12/10/19 0746 98.1 F (36.7 C)     Temp Source 12/10/19 0746 Oral     SpO2 12/10/19 0746 100 %     Weight 12/10/19 0745 (!) 261 lb 0.4 oz (118.4 kg)     Height 12/10/19 0745 5\' 1"  (1.549 m)     Head Circumference --      Peak Flow --      Pain Score 12/10/19 0745 10     Pain Loc --      Pain Edu? --      Excl. in GC? --    Constitutional: Alert and oriented. Well appearing and in no acute distress. Eyes: Conjunctivae are normal.  Head: Atraumatic. Neck: No stridor.   Cardiovascular: Normal rate, regular rhythm. Grossly normal heart sounds.  Good peripheral circulation. Respiratory: Normal respiratory effort.  No retractions. Lungs CTAB. Musculoskeletal: Moves upper and lower extremities with any difficulty.  Normal gait was noted. Neurologic:  Normal speech and language. No gross focal neurologic deficits are appreciated.  Skin:  Skin is warm, dry and intact.  Medial aspect of patient's buttocks, left is markedly tender to palpation.  Area is firm to touch.  No erythema is seen.  No warmth present at this time. Psychiatric: Mood and affect are normal. Speech and behavior are normal.  ____________________________________________   LABS (all labs ordered are listed, but only abnormal results are displayed)  Labs Reviewed - No data  to display  PROCEDURES  Procedure(s) performed (including Critical Care):  Procedures   ____________________________________________   INITIAL IMPRESSION / ASSESSMENT AND PLAN / ED COURSE  As part of my medical decision making, I reviewed the following data within the electronic MEDICAL RECORD NUMBER Notes from prior ED visits and [place] Controlled Substance Database  Catherine Castro was evaluated in Emergency Department on 12/10/2019 for the symptoms described in the history of present illness. She was evaluated in the  context of the global COVID-19 pandemic, which necessitated consideration that the patient might be at risk for infection with the SARS-CoV-2 virus that causes COVID-19. Institutional protocols and algorithms that pertain to the evaluation of patients at risk for COVID-19 are in a state of rapid change based on information released by regulatory bodies including the CDC and federal and state organizations. These policies and algorithms were followed during the patient's care in the ED.  34 year old female presents to the ED with complaint of possible abscess to her buttocks that started 3 days ago.  Patient already knows that it is "too hard to open".  She has been using warm compresses.  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  Patient was made aware that at this time the area is not fluctuant.  She was placed on Bactrim DS twice daily for 10 days along with Percocet as needed for pain.  She is return to the emergency department for an I&D most likely in 1 to 2 days.  ____________________________________________   FINAL CLINICAL IMPRESSION(S) / ED DIAGNOSES  Final diagnoses:  Pilonidal abscess     ED Discharge Orders         Ordered    oxyCODONE-acetaminophen (PERCOCET) 5-325 MG tablet  Every 6 hours PRN     Discontinue  Reprint     12/10/19 0816    sulfamethoxazole-trimethoprim (BACTRIM DS) 800-160 MG tablet  2 times daily     Discontinue  Reprint     12/10/19 0816           Note:  This document was prepared using Dragon voice recognition software and may include unintentional dictation errors.    Tommi Rumps, PA-C 12/10/19 1319    Minna Antis, MD 12/10/19 [phone]

## 2019-12-13 ENCOUNTER — Emergency Department: Payer: Self-pay

## 2019-12-13 ENCOUNTER — Encounter: Payer: Self-pay | Admitting: Emergency Medicine

## 2019-12-13 ENCOUNTER — Emergency Department
Admission: EM | Admit: 2019-12-13 | Discharge: 2019-12-13 | Disposition: A | Discharge status: Home / Self Care | Payer: Self-pay | Attending: Emergency Medicine | Admitting: Emergency Medicine

## 2019-12-13 ENCOUNTER — Other Ambulatory Visit: Payer: Self-pay

## 2019-12-13 DIAGNOSIS — Z20822 Contact with and (suspected) exposure to covid-19: Secondary | ICD-10-CM | POA: Insufficient documentation

## 2019-12-13 DIAGNOSIS — Z87891 Personal history of nicotine dependence: Secondary | ICD-10-CM | POA: Insufficient documentation

## 2019-12-13 DIAGNOSIS — Z7689 Persons encountering health services in other specified circumstances: Secondary | ICD-10-CM

## 2019-12-13 DIAGNOSIS — Z5189 Encounter for other specified aftercare: Secondary | ICD-10-CM

## 2019-12-13 DIAGNOSIS — L0231 Cutaneous abscess of buttock: Secondary | ICD-10-CM | POA: Insufficient documentation

## 2019-12-13 LAB — URINALYSIS, COMPLETE (UACMP) WITH MICROSCOPIC
Bacteria, UA: NONE SEEN
Bilirubin Urine: NEGATIVE
Glucose, UA: NEGATIVE mg/dL
Hgb urine dipstick: NEGATIVE
Ketones, ur: NEGATIVE mg/dL
Leukocytes,Ua: NEGATIVE
Nitrite: NEGATIVE
Protein, ur: NEGATIVE mg/dL
Specific Gravity, Urine: 1.006 (ref 1.005–1.030)
pH: 5 (ref 5.0–8.0)

## 2019-12-13 LAB — CBC WITH DIFFERENTIAL/PLATELET
Abs Immature Granulocytes: 0.03 10*3/uL (ref 0.00–0.07)
Basophils Absolute: 0 10*3/uL (ref 0.0–0.1)
Basophils Relative: 0 %
Eosinophils Absolute: 0.3 10*3/uL (ref 0.0–0.5)
Eosinophils Relative: 4 %
HCT: 37.4 % (ref 36.0–46.0)
Hemoglobin: 13.1 g/dL (ref 12.0–15.0)
Immature Granulocytes: 0 %
Lymphocytes Relative: 22 %
Lymphs Abs: 1.5 10*3/uL (ref 0.7–4.0)
MCH: 30 pg (ref 26.0–34.0)
MCHC: 35 g/dL (ref 30.0–36.0)
MCV: 85.8 fL (ref 80.0–100.0)
Monocytes Absolute: 0.7 10*3/uL (ref 0.1–1.0)
Monocytes Relative: 10 %
Neutro Abs: 4.4 10*3/uL (ref 1.7–7.7)
Neutrophils Relative %: 64 %
Platelets: 350 10*3/uL (ref 150–400)
RBC: 4.36 MIL/uL (ref 3.87–5.11)
RDW: 14.2 % (ref 11.5–15.5)
WBC: 6.9 10*3/uL (ref 4.0–10.5)
nRBC: 0 % (ref 0.0–0.2)

## 2019-12-13 LAB — BASIC METABOLIC PANEL
Anion gap: 7 (ref 5–15)
BUN: 14 mg/dL (ref 6–20)
CO2: 24 mmol/L (ref 22–32)
Calcium: 8.9 mg/dL (ref 8.9–10.3)
Chloride: 103 mmol/L (ref 98–111)
Creatinine, Ser: 0.96 mg/dL (ref 0.44–1.00)
GFR calc Af Amer: >60 mL/min (ref >60)
GFR calc non Af Amer: >60 mL/min (ref >60)
Glucose, Bld: 93 mg/dL (ref 70–99)
Potassium: 4.8 mmol/L (ref 3.5–5.1)
Sodium: 134 mmol/L — ABNORMAL LOW (ref 135–145)

## 2019-12-13 LAB — SARS CORONAVIRUS 2 BY RT PCR (HOSPITAL ORDER, PERFORMED IN ~~LOC~~ HOSPITAL LAB): SARS Coronavirus 2: NEGATIVE

## 2019-12-13 LAB — LACTIC ACID, PLASMA: Lactic Acid, Venous: 0.9 mmol/L (ref 0.5–1.9)

## 2019-12-13 LAB — POCT PREGNANCY, URINE: Preg Test, Ur: NEGATIVE

## 2019-12-13 MED ORDER — HYDROCODONE-ACETAMINOPHEN 5-325 MG PO TABS: 1.0000 | ORAL_TABLET | Freq: Three times a day (TID) | ORAL | 0 refills | Status: AC | PRN

## 2019-12-13 MED ORDER — BUPIVACAINE HCL (PF) 0.5 % IJ SOLN
10.0000 mL | Freq: Once | INTRAMUSCULAR | Status: AC
  Administered 2019-12-13: 10 mL
  Filled 2019-12-13: qty 10

## 2019-12-13 MED ORDER — IOHEXOL 300 MG/ML  SOLN
100.0000 mL | Freq: Once | INTRAMUSCULAR | Status: AC | PRN
  Administered 2019-12-13: 100 mL via INTRAVENOUS
  Filled 2019-12-13: qty 100

## 2019-12-13 MED ORDER — OXYCODONE-ACETAMINOPHEN 5-325 MG PO TABS
1.0000 | ORAL_TABLET | Freq: Once | ORAL | Status: AC
  Administered 2019-12-13: 1 via ORAL
  Filled 2019-12-13: qty 1

## 2019-12-13 MED ORDER — LIDOCAINE HCL (PF) 1 % IJ SOLN
5.0000 mL | Freq: Once | INTRAMUSCULAR | Status: AC
  Administered 2019-12-13: 5 mL
  Filled 2019-12-13: qty 5

## 2019-12-13 MED ORDER — ONDANSETRON 4 MG PO TBDP
4.0000 mg | ORAL_TABLET | Freq: Once | ORAL | Status: AC
  Administered 2019-12-13: 4 mg via ORAL
  Filled 2019-12-13: qty 1

## 2019-12-13 NOTE — ED Triage Notes (Signed)
Pt reports was seen here a couple of days ago and they told her to come back in 2 days to have the area rechecked.

## 2019-12-13 NOTE — ED Notes (Signed)
Pt POC preg negative

## 2019-12-13 NOTE — ED Notes (Signed)
See triage note  Presents with increased pain to buttocks  States she was seen couple of days ago for an abscess was instructed to use heat to area  Now pain is worse

## 2019-12-13 NOTE — ED Provider Notes (Signed)
[facility] Emergency Department Provider Note ____________________________________________  Time seen: 0805  I have reviewed the triage vital signs and the nursing notes.  HISTORY  Chief Complaint  Wound Check  HPI Catherine Castro is a 34 y.o. female presents up to the ED for evaluation of a left buttocks abscess and cellulitis.  Patient was evaluated here 2 days ago and started on antibiotics empirically for what appeared to be a nonfluctuant cellulitis.  She presents today noting continued pain, increased swelling, redness, and no spontaneous drainage.  She denies any interim fever, chills, or sweats but she has been tolerating the Bactrim without difficulty.  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   History reviewed. No pertinent past medical history.  There are no problems to display for this patient.  History reviewed. No pertinent surgical history.  Prior to Admission medications   Medication Sig Start Date End Date Taking? Authorizing Provider  HYDROcodone-acetaminophen (NORCO) 5-325 MG tablet Take 1 tablet by mouth 3 (three) times daily as needed for up to 3 days. 12/13/19 12/16/19  Eziah Negro, Charlesetta Ivory, PA-C  sulfamethoxazole-trimethoprim (BACTRIM DS) 800-160 MG tablet Take 1 tablet by mouth 2 (two) times daily. 12/10/19   Tommi Rumps, PA-C    Allergies Penicillins and Shellfish allergy  No family history on file.  Social History Social History   Tobacco Use  . Smoking status: Former Games developer  . Smokeless tobacco: Never Used  Substance Use Topics  . Alcohol use: Not Currently  . Drug use: Not on file    Review of Systems  Constitutional: Negative for fever. Cardiovascular: Negative for chest pain. Respiratory: Negative for shortness of breath. Gastrointestinal: Negative for abdominal pain, vomiting and diarrhea. Genitourinary: Negative for  dysuria. Musculoskeletal: Negative for back pain. Skin: Negative for rash.  Left buttocks abscess as above. Neurological: Negative for headaches, focal weakness or numbness. ____________________________________________  PHYSICAL EXAM:  VITAL SIGNS: ED Triage Vitals  Enc Vitals Group     BP 12/13/19 0723 (!) 137/94     Pulse Rate 12/13/19 0723 88     Resp 12/13/19 0723 20     Temp 12/13/19 0723 98.4 F (36.9 C)     Temp Source 12/13/19 0723 Oral     SpO2 12/13/19 0723 100 %     Weight 12/13/19 0716 (!) 261 lb (118.4 kg)     Height 12/13/19 0716 5\' 1"  (1.549 m)     Head Circumference --      Peak Flow --      Pain Score 12/13/19 0716 10     Pain Loc --      Pain Edu? --      Excl. in GC? --     Constitutional: Alert and oriented. Well appearing and in no distress. Head: Normocephalic and atraumatic. Eyes: Conjunctivae are normal. Normal extraocular movements Cardiovascular: Normal rate, regular rhythm. Normal distal pulses. Respiratory: Normal respiratory effort.  Gastrointestinal: Soft and nontender. No distention. Musculoskeletal: Nontender with normal range of motion in all extremities.  Neurologic:  Normal gait without ataxia. Normal speech and language. No gross focal neurologic deficits are appreciated. Skin:  Skin is warm, dry and intact. No rash noted.  Left buttocks with significant area of induration and redness to the top of the left buttocks at the gluteal cleft.  No obvious pointing, fluctuance, or spontaneous punctum is appreciated.  Patient exquisitely tender to palpation.  On on exam, it is not clear whether there is a superficial collection of fluid purulence to be drained. Psychiatric: Mood and affect are normal. Patient exhibits appropriate insight and judgment. ____________________________________________   LABS (pertinent positives/negatives) Labs Reviewed  BASIC METABOLIC PANEL - Abnormal; Notable for the following components:      Result Value   Sodium  134 (*)    All other components within normal limits  URINALYSIS, COMPLETE (UACMP) WITH MICROSCOPIC - Abnormal; Notable for the following components:   Color, Urine COLORLESS (*)    APPearance HAZY (*)    All other components within normal limits  SARS CORONAVIRUS 2 BY RT PCR (HOSPITAL ORDER, PERFORMED IN [facility])  CBC WITH DIFFERENTIAL/PLATELET  LACTIC ACID, PLASMA  POC URINE PREG, ED  POCT PREGNANCY, URINE  ____________________________________________   RADIOLOGY  CT Pelvis w/ CM IMPRESSION: 1. Focal fluid collection within the subcutaneous soft tissues adjacent to the intergluteal cleft, left of midline. Collection measures up to 3.0 cm with thin peripheral rim enhancement and prominent surrounding stranding within the adjacent subcutaneous soft tissues. Findings are suggestive of an abscess. 2. No acute intrapelvic abnormality. ____________________________________________  PROCEDURES  Percocet 5-325 mg p.o.  Marland Kitchen.Incision and Drainage  Date/Time: 12/13/2019 12:01 PM Performed by: Lissa Hoard, PA-C Authorized by: Lissa Hoard, PA-C   Consent:    Consent obtained:  Verbal   Consent given by:  Patient   Risks discussed:  Pain and infection   Alternatives discussed:  Referral Location:    Type:  Abscess   Location:  Anogenital   Anogenital location:  Gluteal cleft Pre-procedure details:    Skin preparation:  Betadine Anesthesia (see MAR for exact dosages):    Anesthesia method:  Local infiltration   Local anesthetic:  Lidocaine 1% w/o epi and bupivacaine 0.5% w/o epi Procedure type:    Complexity:  Complex Procedure details:    Needle aspiration: no     Incision types:  Single straight   Incision depth:  Subcutaneous   Scalpel blade:  11   Wound management:  Probed and deloculated and irrigated with saline   Drainage:  Purulent   Drainage amount:  Copious   Wound treatment:  Wound left open   Packing materials:  1/4 in  iodoform gauze   Amount 1/4" iodoform:  6 inches Post-procedure details:    Patient tolerance of procedure:  Tolerated well, no immediate complications  ____________________________________________  INITIAL IMPRESSION / ASSESSMENT AND PLAN / ED COURSE  Patient with ED evaluation of an abscess to the left buttocks with return for possible I&D procedure today.  She was evaluated in the ED under CT imaging at determine whether there was an appreciable abscess that is amenable to bedside I&D.  CT did reveal a collection of fluid purulent material to close to the surface, however it was not easily discernible on gross exam.  Patient agreed to a local I&D procedure, and had meaningful expression of a copious amount of purulent drainage.  The wound was appropriately packed and dressed and patient was discharged with wound care instructions and supplies.  She will continue with the Bactrim as this does not likely represent a failure of outpatient oral therapy.  Labs remained stable with no signs of sepsis.  She will return to this ED in 3 days for wound check and packing removal.  KITTY CADAVID was evaluated in Emergency Department on 12/13/2019 for the symptoms described in the history of present illness. She was  evaluated in the context of the global COVID-19 pandemic, which necessitated consideration that the patient might be at risk for infection with the SARS-CoV-2 virus that causes COVID-19. Institutional protocols and algorithms that pertain to the evaluation of patients at risk for COVID-19 are in a state of rapid change based on information released by regulatory bodies including the CDC and federal and state organizations. These policies and algorithms were followed during the patient's care in the ED.  I reviewed the patient's prescription history over the last 12 months in the multi-state controlled substances database(s) that includes [place], [place], [place], [place], [place], [place],  [place], [place], [place], [place], [place], [place], [place], and [place].  Results were notable for recent RX noted. ____________________________________________  FINAL CLINICAL IMPRESSION(S) / ED DIAGNOSES  Final diagnoses:  Wound check, abscess  Encounter for incision and drainage procedure      Lissa Hoard, PA-C 12/13/19 1431    Jene Every, MD 12/13/19 1441

## 2019-12-13 NOTE — Discharge Instructions (Addendum)
You have had your abscess opened and drained today for treatment.  Wound packing has been placed inside the abscess.  Keep the wound clean, covered, and dry.  Apply warm compresses to promote healing.  Take the previous antibiotic as prescribed, return to the ED in 3 days for packing removal and wound check.

## 2020-03-21 ENCOUNTER — Emergency Department
Admission: EM | Admit: 2020-03-21 | Discharge: 2020-03-21 | Disposition: A | Discharge status: Home / Self Care | Payer: Self-pay | Attending: Emergency Medicine | Admitting: Emergency Medicine

## 2020-03-21 ENCOUNTER — Encounter: Payer: Self-pay | Admitting: Emergency Medicine

## 2020-03-21 ENCOUNTER — Other Ambulatory Visit: Payer: Self-pay

## 2020-03-21 DIAGNOSIS — B9789 Other viral agents as the cause of diseases classified elsewhere: Secondary | ICD-10-CM | POA: Insufficient documentation

## 2020-03-21 DIAGNOSIS — Z880 Allergy status to penicillin: Secondary | ICD-10-CM | POA: Insufficient documentation

## 2020-03-21 DIAGNOSIS — J029 Acute pharyngitis, unspecified: Secondary | ICD-10-CM

## 2020-03-21 DIAGNOSIS — J028 Acute pharyngitis due to other specified organisms: Secondary | ICD-10-CM | POA: Insufficient documentation

## 2020-03-21 MED ORDER — CEPHALEXIN 500 MG PO CAPS
500.0000 mg | ORAL_CAPSULE | Freq: Two times a day (BID) | ORAL | 0 refills | Status: AC
Start: 2020-03-21 — End: 2020-03-31

## 2020-03-21 MED ORDER — LIDOCAINE VISCOUS HCL 2 % MT SOLN: 15.0000 mL | OROMUCOSAL | 0 refills | Status: DC | PRN

## 2020-03-21 NOTE — ED Provider Notes (Signed)
[facility] Emergency Department Provider Note  ____________________________________________  Time seen: Approximately 9:21 AM  I have reviewed the triage vital signs and the nursing notes.   HISTORY  Chief Complaint Sore Throat    HPI Catherine Castro is a 34 y.o. female who presents to the emergency department for treatment and evaluation of sore throat. Symptoms started 3 or 4 days ago and is progressively worsening. She had a fever over the weekend, but none over the past couple of days. She tested negative for COVID on Sunday. Symptoms have progressed to headache, earache, and extreme pain with swallowing. She "would rather spit than swallow."  History reviewed. No pertinent past medical history.  There are no problems to display for this patient.   History reviewed. No pertinent surgical history.  Prior to Admission medications   Medication Sig Start Date End Date Taking? Authorizing Provider  cephALEXin (KEFLEX) 500 MG capsule Take 1 capsule (500 mg total) by mouth 2 (two) times daily for 10 days. 03/21/20 03/31/20  Niurka Benecke, Rulon Eisenmenger B, FNP  lidocaine (XYLOCAINE) 2 % solution Use as directed 15 mLs in the mouth or throat every 4 (four) hours as needed for mouth pain. 03/21/20   Chinita Pester, FNP    Allergies Penicillins and Shellfish allergy  History reviewed. No pertinent family history.  Social History Social History   Tobacco Use  . Smoking status: Former Games developer  . Smokeless tobacco: Never Used  Substance Use Topics  . Alcohol use: Not Currently  . Drug use: Not on file    Review of Systems Constitutional: Positive for fever. Eyes: No visual changes. ENT: Positive for sore throat; negative for difficulty swallowing. Respiratory: Denies shortness of breath. Gastrointestinal: Negative for abdominal pain.  No nausea, no vomiting.  No diarrhea.  Genitourinary: Negative for dysuria. Negative for decrease in need to void. Musculoskeletal:  Negative for generalized body aches. Skin: Negative for rash. Neurological: Positive for headaches, negative for focal weakness or numbness.  ____________________________________________   PHYSICAL EXAM:  VITAL SIGNS: ED Triage Vitals [03/21/20 0832]  Enc Vitals Group     BP 140/74     Pulse Rate 85     Resp 17     Temp 98.3 F (36.8 C)     Temp Source Oral     SpO2 100 %     Weight 262 lb (118.8 kg)     Height 5\' 1"  (1.549 m)     Head Circumference      Peak Flow      Pain Score 7     Pain Loc      Pain Edu?      Excl. in GC?     Constitutional: Alert and oriented. Well appearing and in no acute distress. Eyes: Conjunctivae are normal.  Head: Atraumatic. Nose: No congestion/rhinnorhea. Mouth/Throat: Mucous membranes are moist.  Oropharynx erythematous, tonsils 2+ with exudate. Uvula is midline. Ears: Left tympanic membrane appears normal. Right tympanic membrane appears normal. Neck: No stridor. Voice clear Lymphatic: Anterior cervical nodes palpable and tender. Cardiovascular: Normal rate, regular rhythm. Good peripheral circulation. Respiratory: Normal respiratory effort. Lungs CTAB. Gastrointestinal: Soft and nontender. Musculoskeletal: FROM of neck, upper and lower extremities. Neurologic:  Normal speech and language. No gross focal neurologic deficits are appreciated. Skin:  Skin is warm, dry and intact. No rash noted Psychiatric: Mood and affect are normal. Speech and behavior are normal.  ____________________________________________   LABS (all labs ordered are listed, but only abnormal results are displayed)  Labs Reviewed - No data to display ____________________________________________  EKG  Not indiated ____________________________________________  RADIOLOGY  Not indicated. ____________________________________________   PROCEDURES  Procedure(s) performed: None  Critical Care performed:  No ____________________________________________   INITIAL IMPRESSION / ASSESSMENT AND PLAN / ED COURSE  34 year old female presenting to the emergency department for treatment and evaluation of sore throat.  See HPI for further details.  Symptoms and exam are consistent with trip to coccal pharyngitis.  She is allergic to penicillin and will therefore be treated with Keflex.  Her penicillin reaction was a rash only and that was as a young child.  She will also be given viscous lidocaine solution and encouraged to take Tylenol or ibuprofen.  Pertinent labs & imaging results that were available during my care of the patient were reviewed by me and considered in my medical decision making (see chart for details). ____________________________________________  Discharge Medication List as of 03/21/2020  9:37 AM    START taking these medications   Details  cephALEXin (KEFLEX) 500 MG capsule Take 1 capsule (500 mg total) by mouth 2 (two) times daily for 10 days., Starting Wed 03/21/2020, Until Sat 03/31/2020, Normal    lidocaine (XYLOCAINE) 2 % solution Use as directed 15 mLs in the mouth or throat every 4 (four) hours as needed for mouth pain., Starting Wed 03/21/2020, Normal        FINAL CLINICAL IMPRESSION(S) / ED DIAGNOSES  Final diagnoses:  Exudative pharyngitis    If controlled substance prescribed during this visit, 12 month history viewed on the [facility] prior to issuing an initial prescription for Schedule II or III opiod.   Note:  This document was prepared using Dragon voice recognition software and may include unintentional dictation errors.   Chinita Pester, FNP 03/21/20 1248    Jene Every, MD 03/21/20 1327

## 2020-03-21 NOTE — ED Triage Notes (Signed)
Pt comes into the eD via POV c/o sore throat.  Pt states she was COVID tested Sunday with results being negative.  Pt in NAD at this time with even and unlabored respirations.

## 2020-06-22 ENCOUNTER — Other Ambulatory Visit: Payer: Self-pay

## 2020-06-22 ENCOUNTER — Encounter: Payer: Self-pay | Admitting: Emergency Medicine

## 2020-06-22 ENCOUNTER — Emergency Department
Admission: EM | Admit: 2020-06-22 | Discharge: 2020-06-22 | Disposition: A | Discharge status: Home / Self Care | Payer: Self-pay | Attending: Emergency Medicine | Admitting: Emergency Medicine

## 2020-06-22 DIAGNOSIS — L02416 Cutaneous abscess of left lower limb: Secondary | ICD-10-CM | POA: Insufficient documentation

## 2020-06-22 DIAGNOSIS — Z87891 Personal history of nicotine dependence: Secondary | ICD-10-CM | POA: Insufficient documentation

## 2020-06-22 DIAGNOSIS — L02211 Cutaneous abscess of abdominal wall: Secondary | ICD-10-CM | POA: Insufficient documentation

## 2020-06-22 MED ORDER — HYDROCODONE-ACETAMINOPHEN 5-325 MG PO TABS: 1.0000 | ORAL_TABLET | Freq: Four times a day (QID) | ORAL | 0 refills | Status: DC | PRN

## 2020-06-22 NOTE — ED Provider Notes (Signed)
[facility] Emergency Department Provider Note   ____________________________________________   Event Date/Time   First MD Initiated Contact with Patient 06/22/20 [phone]     (approximate)  I have reviewed the triage vital signs and the nursing notes.   HISTORY  Chief Complaint Abscess   HPI Catherine Castro is a 35 y.o. female presents to the ED for evaluation of abscesses.  Patient was seen by her PCP and prescribed doxycycline for abscesses that were not ready to be lanced.  Patient has been using warm compresses to it frequently.  She woke this morning with both areas draining.  She is taking over-the-counter medication with short-term relief of her pain.  Patient currently rates her pain as a 10/10.       History reviewed. No pertinent past medical history.  There are no problems to display for this patient.   History reviewed. No pertinent surgical history.  Prior to Admission medications   Medication Sig Start Date End Date Taking? Authorizing Provider  doxycycline (ADOXA) 100 MG tablet Take 100 mg by mouth 2 (two) times daily.   Yes [provider]  HYDROcodone-acetaminophen (NORCO/VICODIN) 5-325 MG tablet Take 1 tablet by mouth every 6 (six) hours as needed. 06/22/20  Yes Tommi Rumps, PA-C    Allergies Penicillins and Shellfish allergy  No family history on file.  Social History Social History   Tobacco Use  . Smoking status: Former Games developer  . Smokeless tobacco: Never Used  Substance Use Topics  . Alcohol use: Not Currently  . Drug use: Not Currently    Review of Systems Constitutional: No fever/chills Cardiovascular: Denies chest pain. Respiratory: Denies shortness of breath. Gastrointestinal: No abdominal pain.  No nausea, no vomiting.   Musculoskeletal: Negative for back pain. Skin: 2 abscesses one to the lower abdomen and the other left upper leg. Neurological: Negative for headaches, focal weakness or  numbness. ____________________________________________   PHYSICAL EXAM:  VITAL SIGNS: ED Triage Vitals  Enc Vitals Group     BP 06/22/20 0705 (!) 136/93     Pulse Rate 06/22/20 0705 89     Resp 06/22/20 0705 16     Temp 06/22/20 0705 98.4 F (36.9 C)     Temp Source 06/22/20 0705 Oral     SpO2 06/22/20 0705 99 %     Weight 06/22/20 0706 280 lb (127 kg)     Height 06/22/20 0706 5\' 1"  (1.549 m)     Head Circumference --      Peak Flow --      Pain Score 06/22/20 0705 10     Pain Loc --      Pain Edu? --      Excl. in GC? --     Constitutional: Alert and oriented. Well appearing and in no acute distress. Eyes: Conjunctivae are normal.  Head: Atraumatic. Neck: No stridor.   Cardiovascular: Normal rate, regular rhythm. Grossly normal heart sounds.  Good peripheral circulation. Respiratory: Normal respiratory effort.  No retractions. Lungs CTAB. Gastrointestinal: Soft and nontender. No distention.  Musculoskeletal: Patient is ambulatory without any assistance. Neurologic:  Normal speech and language. No gross focal neurologic deficits are appreciated.  Skin:  Skin is warm.  Patient has 2 open copiously draining abscesses.  One is present on the anterior left thigh.  The other is on the right lower abdomen.  Both are open.  Tender to light palpation. Psychiatric: Mood and affect are normal. Speech and behavior are normal.  ____________________________________________  LABS (all labs ordered are listed, but only abnormal results are displayed)  Labs Reviewed - No data to display  PROCEDURES  Procedure(s) performed (including Critical Care):  Procedures   ____________________________________________   INITIAL IMPRESSION / ASSESSMENT AND PLAN / ED COURSE  As part of my medical decision making, I reviewed the following data within the electronic MEDICAL RECORD NUMBER Notes from prior ED visits and [place] Controlled Substance Database  35 year old female presents to the ED with  abscesses 1 to her left leg and 1 to the right lower abdomen.  Patient was seen by her PCP earlier this week and placed on antibiotics.  Patient states while in the shower this morning both abscesses opened and began draining.  Copious amount of yellow purulence is noted from both areas and are draining well.  Patient was made aware that I&D is not necessary at this point.  She is encouraged to complete the entire antibiotic prescription.  Patient is encouraged to continue with warm moist compresses.  She is aware that she can return to the emergency department if any worsening of her symptoms or changes that are concerning over the weekend.  ____________________________________________   FINAL CLINICAL IMPRESSION(S) / ED DIAGNOSES  Final diagnoses:  Cutaneous abscess of left lower extremity  Abscess of skin of abdomen     ED Discharge Orders         Ordered    HYDROcodone-acetaminophen (NORCO/VICODIN) 5-325 MG tablet  Every 6 hours PRN        06/22/20 0730          *Please note:  Catherine Castro was evaluated in Emergency Department on 06/22/2020 for the symptoms described in the history of present illness. She was evaluated in the context of the global COVID-19 pandemic, which necessitated consideration that the patient might be at risk for infection with the SARS-CoV-2 virus that causes COVID-19. Institutional protocols and algorithms that pertain to the evaluation of patients at risk for COVID-19 are in a state of rapid change based on information released by regulatory bodies including the CDC and federal and state organizations. These policies and algorithms were followed during the patient's care in the ED.  Some ED evaluations and interventions may be delayed as a result of limited staffing during and the pandemic.*   Note:  This document was prepared using Dragon voice recognition software and may include unintentional dictation errors.    Tommi Rumps, PA-C 06/22/20 1506     Gilles Chiquito, MD 06/22/20 [phone]

## 2020-06-22 NOTE — ED Triage Notes (Signed)
Pt to ED via POV stating that she has an abscess on her left leg and right lower abdomen. Pt states that she went to see her PCP the other day but they were not able to lance the area because it was too hard. Pt reports that this morning it is soft and draining. Pt is in NAD. Pt is on oral antibiotics.

## 2020-06-22 NOTE — ED Notes (Signed)
See triage note  Presents with abscess areas to lower abd and left upper leg  States she has a hx of same    Was placed on doxy by PCP

## 2020-06-22 NOTE — Discharge Instructions (Addendum)
Continue taking the antibiotic until completely finished that was prescribed by your primary care provider.  A prescription for hydrocodone was sent to your pharmacy.  You may continue taking the ibuprofen for inflammation however for severe pain the hydrocodone can be taken every 6 hours.  Do not drive or operate machinery while taking this medication as it could cause drowsiness.  Continue using warm moist heat to the area frequently.

## 2020-09-07 IMAGING — CR LEFT FOOT - COMPLETE 3+ VIEW
1 series · 3 of 3 positions shown · non-contrast
Comparison: None.

CLINICAL DATA: Plantar left foot pain for the past 3 days. No
injury.

EXAM:
LEFT FOOT - COMPLETE 3+ VIEW

[Series 1: dg foot complete left · 0.14mm/px · 3 of 3 slices shown]
[im 1/3]
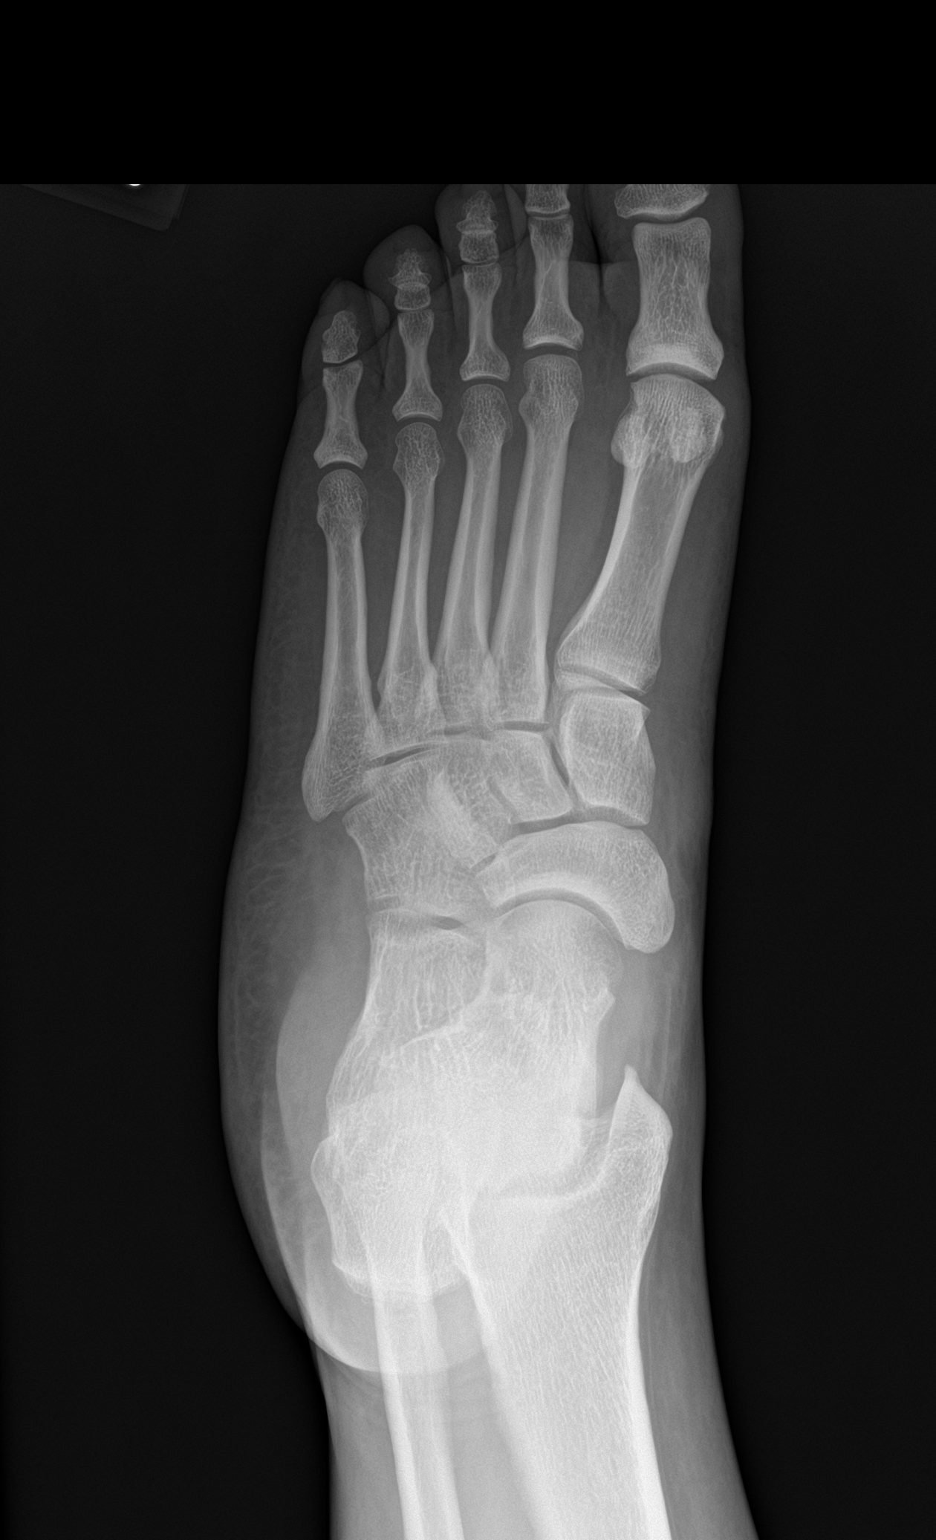
[im 2/3]
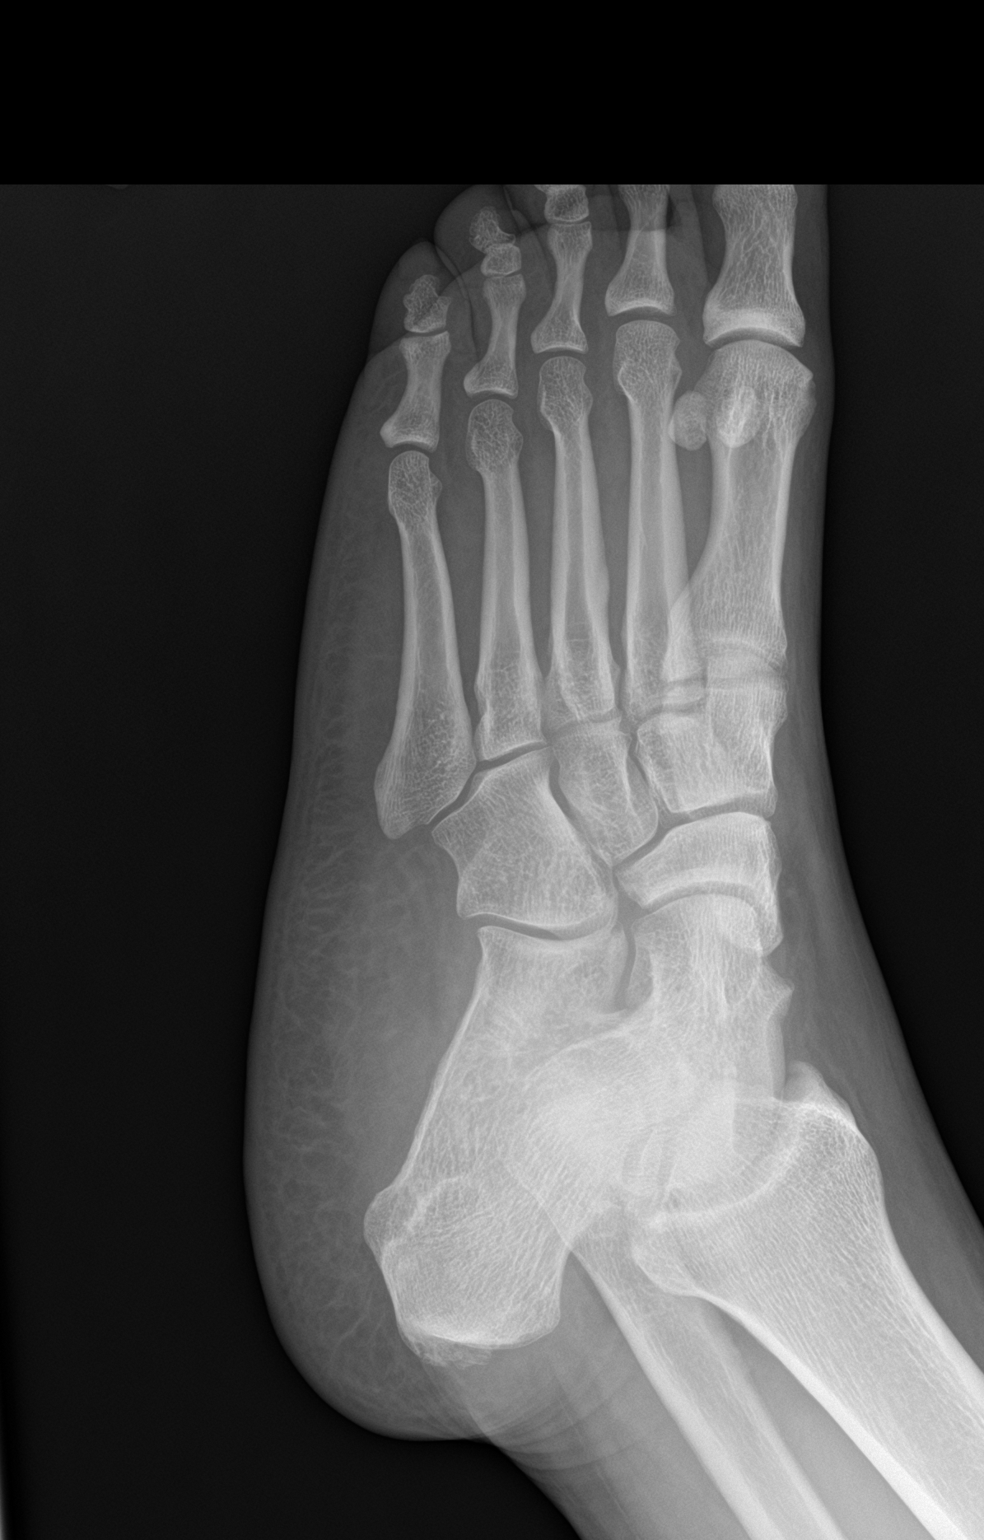
[im 3/3]
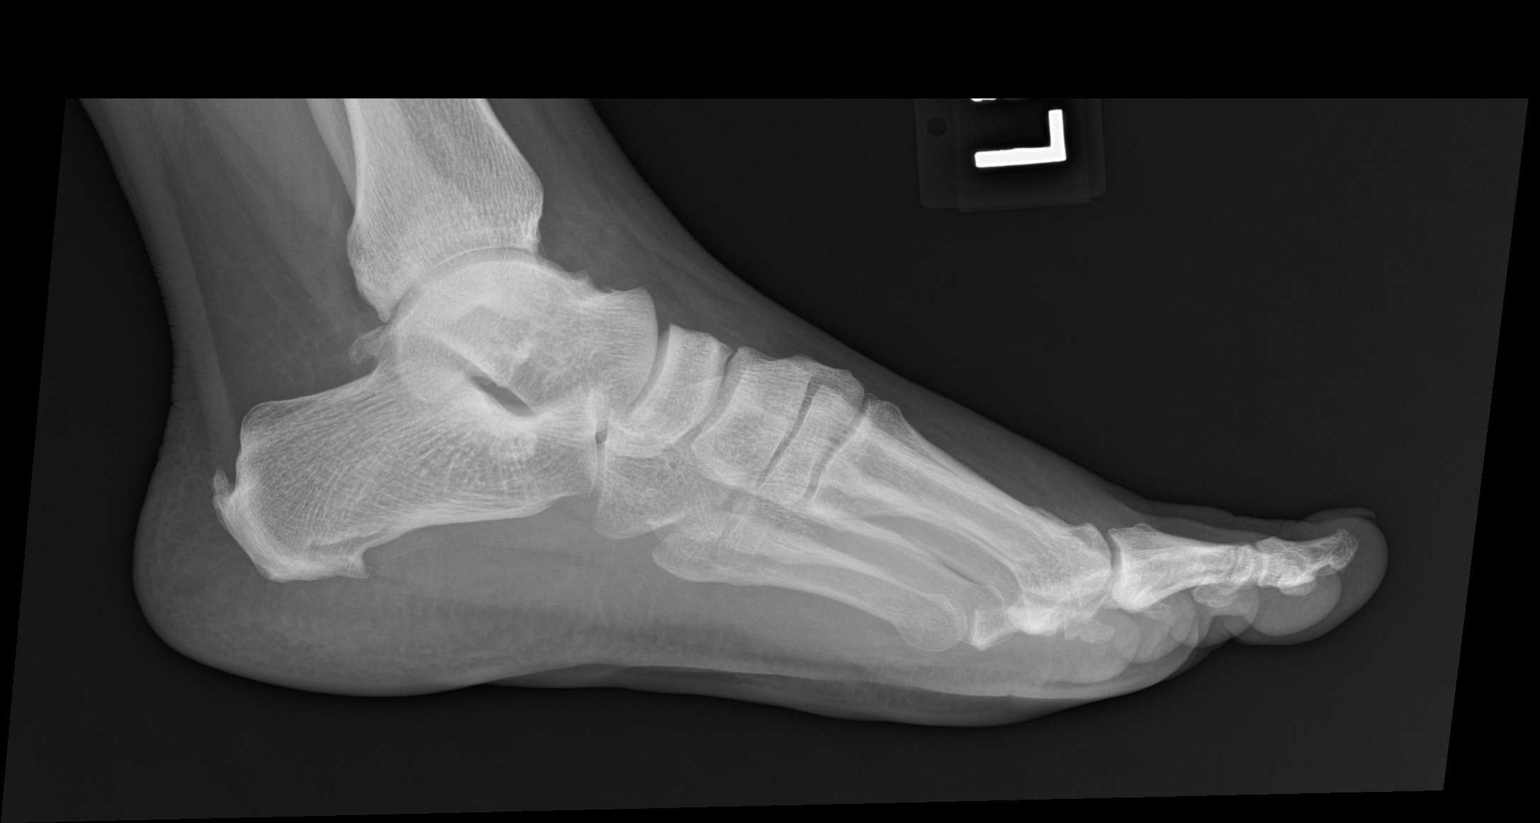

[3 of 3 positions shown; findings below may reference images not displayed]

FINDINGS: No acute fracture or dislocation. Joint spaces are preserved.
Plantar and Achilles enthesophytes. Bone mineralization is normal.
Soft tissues are unremarkable.
IMPRESSION: 1. No acute osseous abnormality or significant degenerative changes.
2. Calcaneal enthesopathy.

## 2020-12-20 ENCOUNTER — Encounter: Payer: Self-pay | Admitting: Physician Assistant

## 2020-12-20 ENCOUNTER — Other Ambulatory Visit: Payer: Self-pay

## 2020-12-20 ENCOUNTER — Emergency Department
Admission: EM | Admit: 2020-12-20 | Discharge: 2020-12-20 | Disposition: A | Discharge status: Home / Self Care | Payer: Self-pay | Attending: Emergency Medicine | Admitting: Emergency Medicine

## 2020-12-20 DIAGNOSIS — L0231 Cutaneous abscess of buttock: Secondary | ICD-10-CM | POA: Insufficient documentation

## 2020-12-20 DIAGNOSIS — Z87891 Personal history of nicotine dependence: Secondary | ICD-10-CM | POA: Insufficient documentation

## 2020-12-20 MED ORDER — SULFAMETHOXAZOLE-TRIMETHOPRIM 800-160 MG PO TABS: 1.0000 | ORAL_TABLET | Freq: Two times a day (BID) | ORAL | 0 refills | Status: DC

## 2020-12-20 MED ORDER — SULFAMETHOXAZOLE-TRIMETHOPRIM 800-160 MG PO TABS
1.0000 | ORAL_TABLET | Freq: Once | ORAL | Status: AC
  Administered 2020-12-20: 1 via ORAL
  Filled 2020-12-20: qty 1

## 2020-12-20 MED ORDER — PENTAFLUOROPROP-TETRAFLUOROETH EX AERO
1.0000 | INHALATION_SPRAY | CUTANEOUS | Status: DC | PRN
Start: 2020-12-20 — End: 2020-12-20
  Filled 2020-12-20: qty 30

## 2020-12-20 MED ORDER — TRAMADOL HCL 50 MG PO TABS: 50.0000 mg | ORAL_TABLET | Freq: Two times a day (BID) | ORAL | 0 refills | Status: AC

## 2020-12-20 MED ORDER — LIDOCAINE HCL (PF) 1 % IJ SOLN
5.0000 mL | Freq: Once | INTRAMUSCULAR | Status: DC
  Filled 2020-12-20: qty 5

## 2020-12-20 NOTE — ED Notes (Signed)
See triage note  Presents with possible abscess  States she has one on her upper thigh which is draining some  And then possible 1 on her buttocks

## 2020-12-20 NOTE — Discharge Instructions (Signed)
Take the antibiotic as directed and the pain medicine as needed. Follow-up with your provider or return to the ED if needed.

## 2020-12-20 NOTE — ED Provider Notes (Signed)
[facility] Emergency Department Provider Note ____________________________________________  Time seen: 0725  I have reviewed the triage vital signs and the nursing notes.  HISTORY  Chief Complaint  Abscess   HPI Catherine Castro is a 35 y.o. female presents to the ED for evaluation of possible abscess to the right buttocks has been present since Monday patient also has an area to her inner right thigh, that spontaneously open, but still reports some tenderness there.  She denies any fevers, chills, sweats.  History reviewed. No pertinent past medical history.  There are no problems to display for this patient.   History reviewed. No pertinent surgical history.  Prior to Admission medications   Medication Sig Start Date End Date Taking? Authorizing Provider  sulfamethoxazole-trimethoprim (BACTRIM DS) 800-160 MG tablet Take 1 tablet by mouth 2 (two) times daily. 12/20/20  Yes Ceara Wrightson, Charlesetta Ivory, PA-C  traMADol (ULTRAM) 50 MG tablet Take 1 tablet (50 mg total) by mouth 2 (two) times daily for 5 days. 12/20/20 12/25/20 Yes Eleyna Brugh, Charlesetta Ivory, PA-C    Allergies Penicillins and Shellfish allergy  History reviewed. No pertinent family history.  Social History Social History   Tobacco Use   Smoking status: Former   Smokeless tobacco: Never  Substance Use Topics   Alcohol use: Not Currently   Drug use: Not Currently    Review of Systems  Constitutional: Negative for fever. Cardiovascular: Negative for chest pain. Respiratory: Negative for shortness of breath. Gastrointestinal: Negative for abdominal pain, vomiting and diarrhea. Genitourinary: Negative for dysuria. Musculoskeletal: Negative for back pain. Skin: Negative for rash.  Skin abscess as above. Neurological: Negative for headaches, focal weakness or numbness. ____________________________________________  PHYSICAL EXAM:  VITAL SIGNS: ED Triage Vitals  Enc Vitals Group     BP  12/20/20 0636 (!) 155/98     Pulse Rate 12/20/20 0636 88     Resp 12/20/20 0636 18     Temp 12/20/20 0636 98.5 F (36.9 C)     Temp Source 12/20/20 0636 Oral     SpO2 12/20/20 0636 100 %     Weight 12/20/20 0633 261 lb (118.4 kg)     Height 12/20/20 0633 5\' 1"  (1.549 m)     Head Circumference --      Peak Flow --      Pain Score 12/20/20 0633 10     Pain Loc --      Pain Edu? --      Excl. in GC? --     Constitutional: Alert and oriented. Well appearing and in no distress. Head: Normocephalic and atraumatic. Eyes: Conjunctivae are normal. Normal extraocular movements Cardiovascular: Normal rate, regular rhythm. Normal distal pulses. Respiratory: Normal respiratory effort. No wheezes/rales/rhonchi. Gastrointestinal: Soft and nontender. No distention. Musculoskeletal: Nontender with normal range of motion in all extremities.  Neurologic:  Normal gait without ataxia. Normal speech and language. No gross focal neurologic deficits are appreciated. Skin:  Skin is warm, dry and intact. No rash noted.  Patient with a firm indurated area to the subcutaneous portion of her right buttocks, concerning for early local abscess.  No obvious pointing or fluctuance is appreciated.  Patient has another area to the inner thigh measuring about a centimeter that appears open without any active draining. Psychiatric: Mood and affect are normal. Patient exhibits appropriate insight and judgment. ____________________________________________    {LABS (pertinent positives/negatives)  ____________________________________________  {EKG  ____________________________________________   RADIOLOGY Official radiology report(s): No results found. ____________________________________________  PROCEDURES  Bactrim  DS 1 p.o.  Procedures ____________________________________________   INITIAL IMPRESSION / ASSESSMENT AND PLAN / ED COURSE  As part of my medical decision making, I reviewed the following data  within the electronic MEDICAL RECORD NUMBER Notes from prior ED visits     Patient ED evaluation of an area to the right buttocks concerning for local cellulitis/abscess.  The area is small at this time, the patient has asked that we not attempt local I&D procedure patient be started empirically on Bactrim, will continue to monitor and treat the area as appropriate patient is to return to the ED or primary provider for further wound care if necessary.  Return precautions have been reviewed.   Catherine Castro was evaluated in Emergency Department on 12/20/2020 for the symptoms described in the history of present illness. She was evaluated in the context of the global COVID-19 pandemic, which necessitated consideration that the patient might be at risk for infection with the SARS-CoV-2 virus that causes COVID-19. Institutional protocols and algorithms that pertain to the evaluation of patients at risk for COVID-19 are in a state of rapid change based on information released by regulatory bodies including the CDC and federal and state organizations. These policies and algorithms were followed during the patient's care in the ED. ____________________________________________  FINAL CLINICAL IMPRESSION(S) / ED DIAGNOSES  Final diagnoses:  Abscess of buttock, right      Martha Ellerby, Charlesetta Ivory, PA-C 12/20/20 1651    Merwyn Katos, MD 12/21/20 [phone]

## 2020-12-20 NOTE — ED Triage Notes (Signed)
Pt states she has abscess to buttocks and thigh since Monday. States has noted some drainage.

## 2021-03-19 IMAGING — CR DG CHEST 2V
1 series · 2 of 2 positions shown · non-contrast
Comparison: Radiographs 07/23/2018.

CLINICAL DATA: Chest pain, coughing and vomiting. Recent FZCEA-ST
diagnosis.

EXAM:
CHEST - 2 VIEW

[Series 1: w chest pa · 0.14mm/px · 2 of 2 slices shown]
[im 1/2]
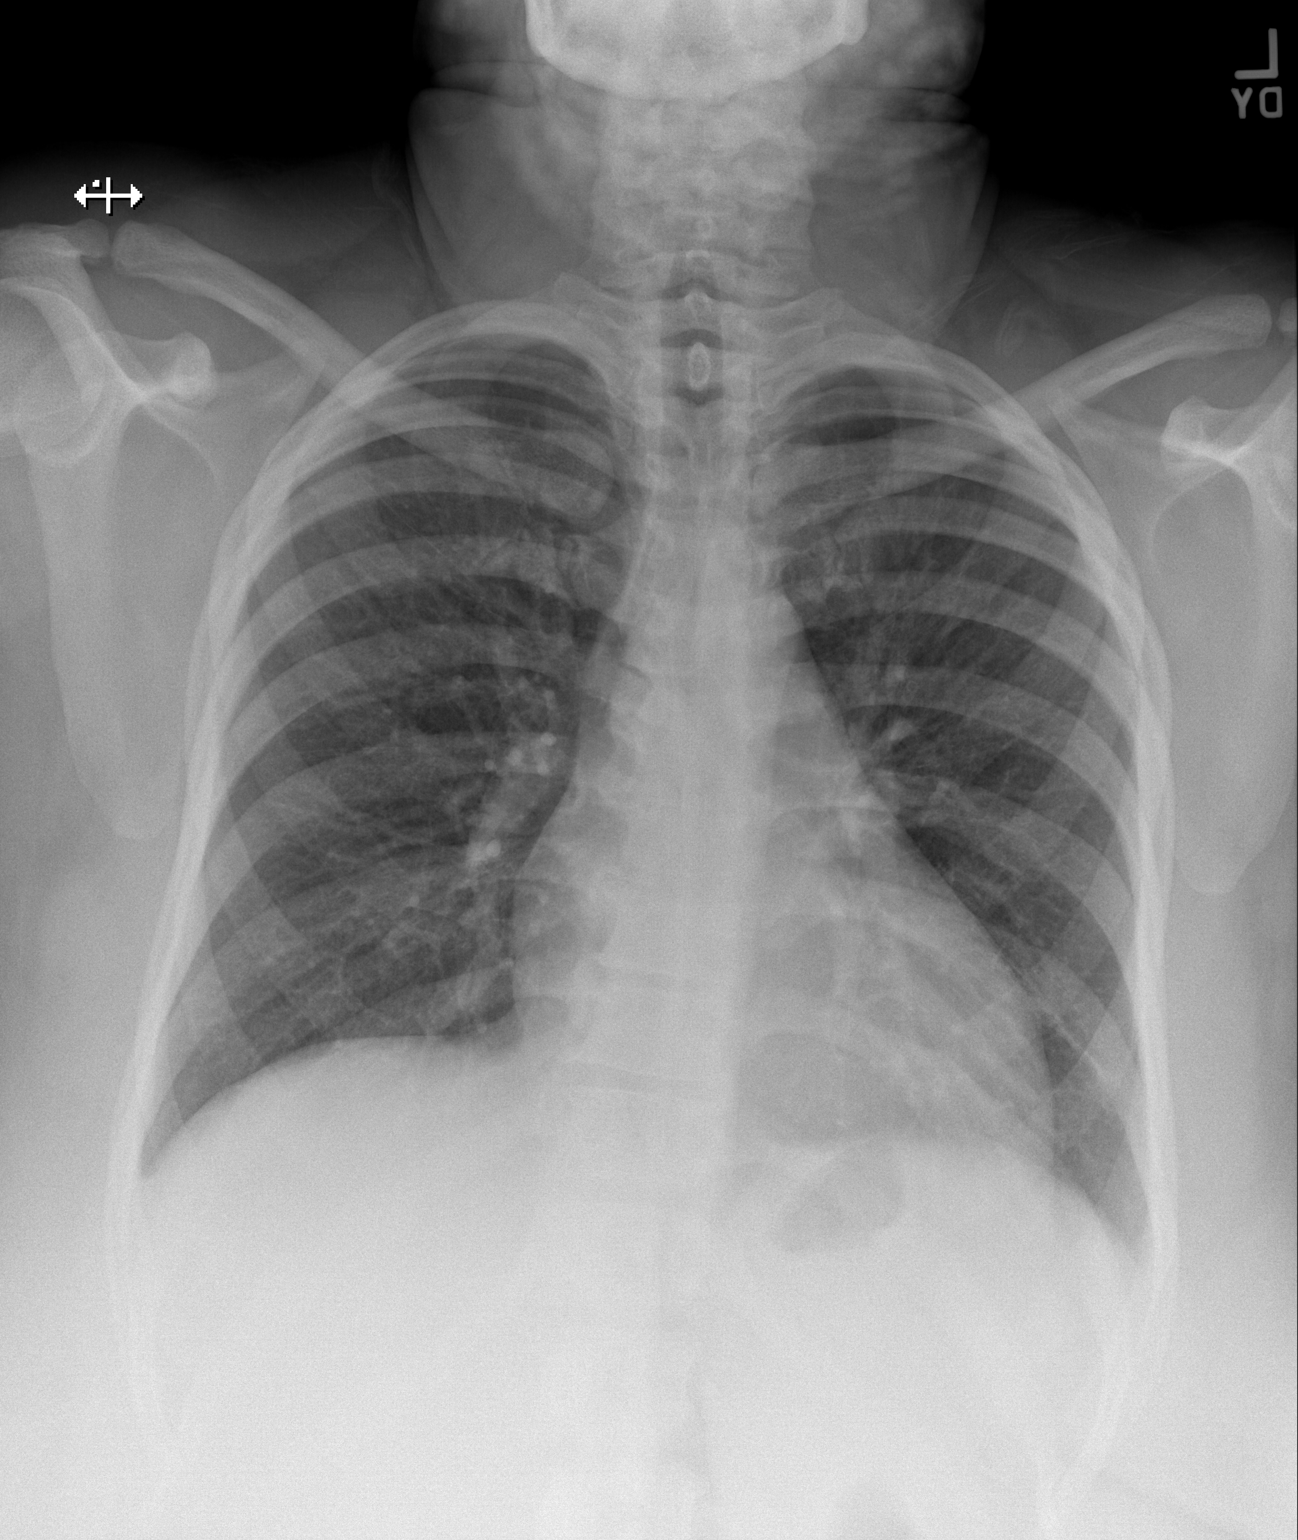
[im 2/2]
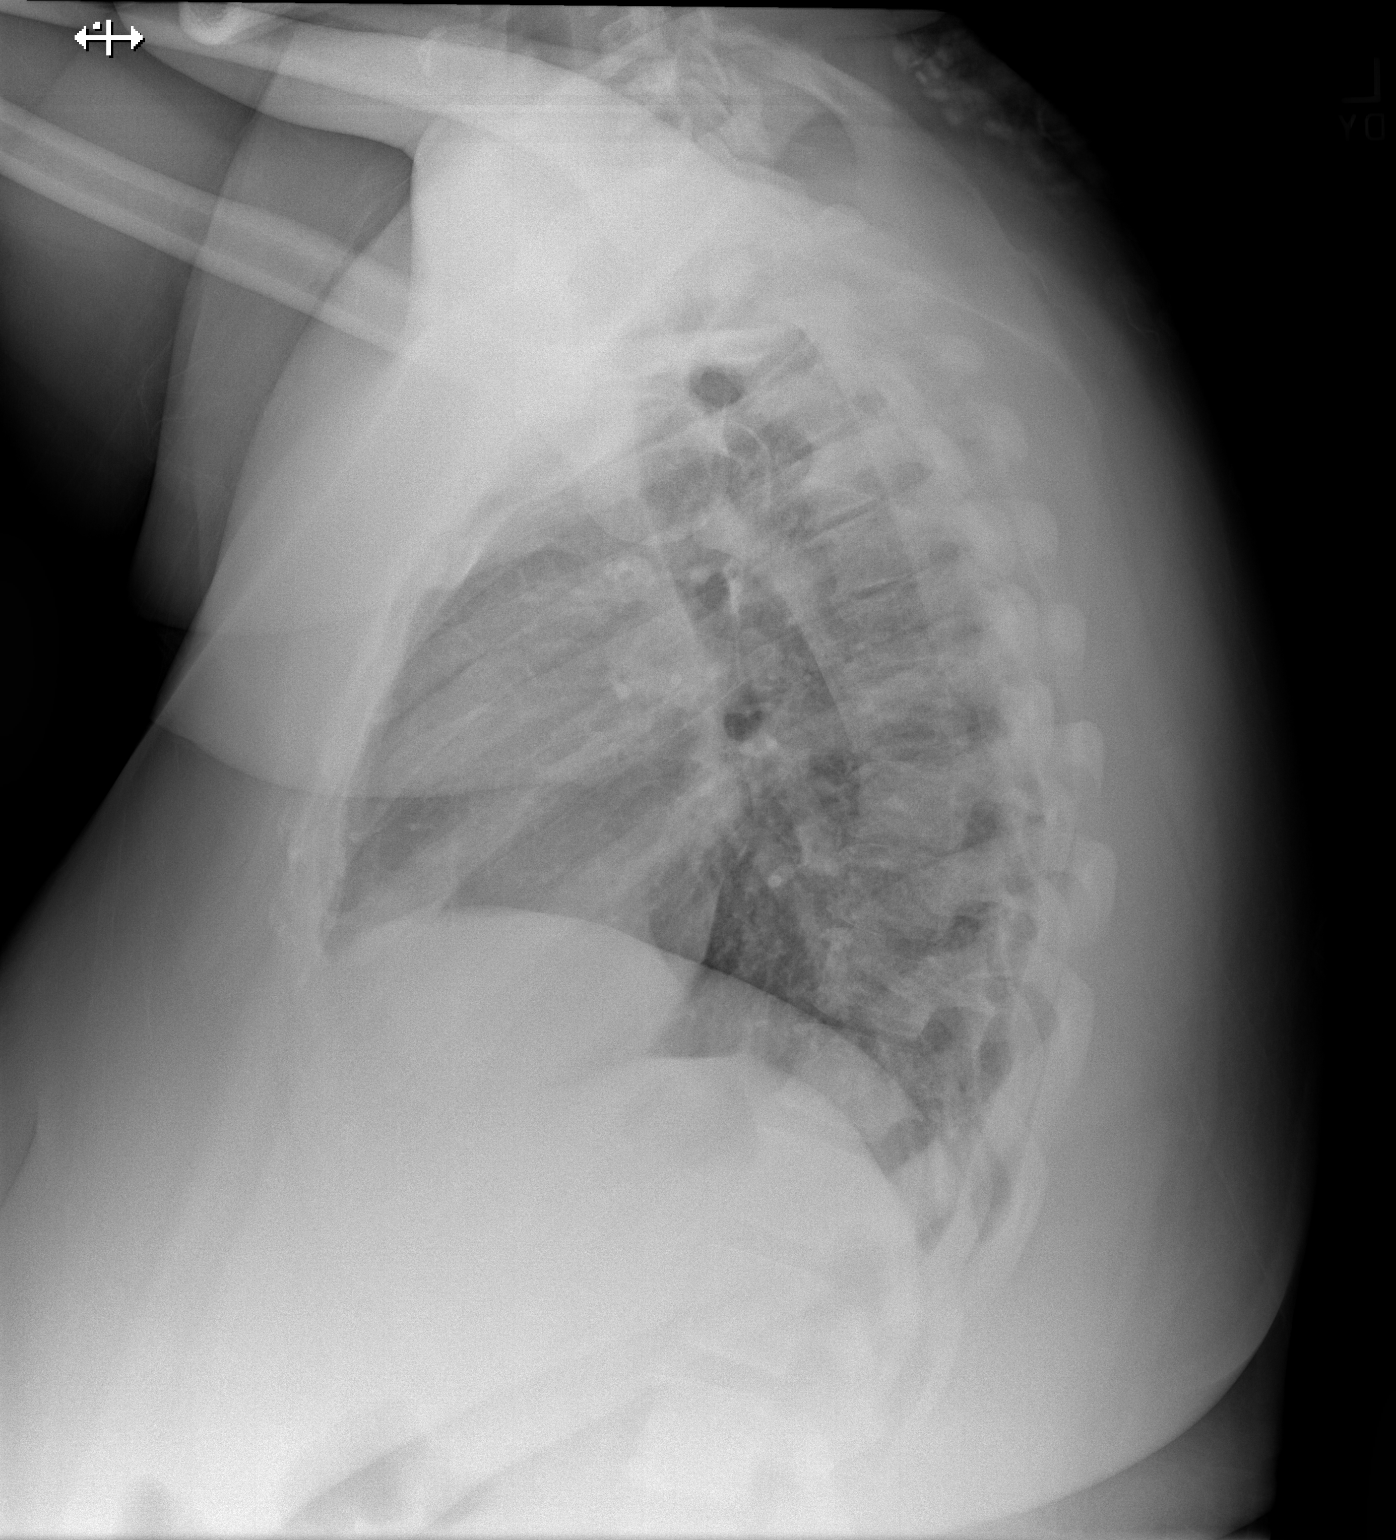

[2 of 2 positions shown; findings below may reference images not displayed]

FINDINGS: Stable mild cardiac enlargement. The mediastinal contours are
normal. There is possible mild central airway thickening, but no
ground-glass or confluent airspace opacities are identified. There
is no pleural effusion or pneumothorax. The bones appear unchanged.
IMPRESSION: Stable mild cardiomegaly with possible mild central airway
thickening. No focal airspace disease or edema.

## 2021-08-19 ENCOUNTER — Emergency Department (HOSPITAL_COMMUNITY)
Admission: EM | Admit: 2021-08-19 | Discharge: 2021-08-19 | Disposition: A | Discharge status: Home / Self Care | Payer: Self-pay | Attending: Emergency Medicine | Admitting: Emergency Medicine

## 2021-08-19 ENCOUNTER — Encounter (HOSPITAL_COMMUNITY): Payer: Self-pay | Admitting: *Deleted

## 2021-08-19 ENCOUNTER — Other Ambulatory Visit: Payer: Self-pay

## 2021-08-19 DIAGNOSIS — L02411 Cutaneous abscess of right axilla: Secondary | ICD-10-CM | POA: Insufficient documentation

## 2021-08-19 MED ORDER — LIDOCAINE-EPINEPHRINE (PF) 2 %-1:200000 IJ SOLN
20.0000 mL | Freq: Once | INTRAMUSCULAR | Status: DC
  Filled 2021-08-19: qty 20

## 2021-08-19 MED ORDER — TRAMADOL HCL 50 MG PO TABS: 50.0000 mg | ORAL_TABLET | Freq: Four times a day (QID) | ORAL | 0 refills | Status: DC | PRN

## 2021-08-19 MED ORDER — DOXYCYCLINE HYCLATE 100 MG PO CAPS: 100.0000 mg | ORAL_CAPSULE | Freq: Two times a day (BID) | ORAL | 0 refills | Status: DC

## 2021-08-19 NOTE — Discharge Instructions (Addendum)
Take Tramadol for pain ?Take the antibiotic doxycycline as directed until finished  ?Please see the contact information for the dermatology clinic I have referred you to, please call and schedule an appointment  ?Keep wound clean with warm soap and water and keep bandage dry, do not submerge in water for 24 hours. Change bandage sooner if it gets dirty ?Return for fever, increased redness, swelling, pain, or worsening drainage ? ?

## 2021-08-19 NOTE — ED Provider Notes (Signed)
?[facility] EMERGENCY DEPARTMENT ?Provider Note ? ? ?CSN: [id number] ?Arrival date & time: 08/19/21  7782 ? ?  ? ?History ? ?Chief Complaint  ?Patient presents with  ? Abscess  ? ? ?Catherine Castro is a 36 y.o. female. ? 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  Patient states that she had another abscess just distal to the current abscess approximately 2 weeks ago, was seen at [facility] and had an I&D performed.  She was also given Bactrim and Keflex and finished antibiotics about a week ago.  She states she noticed the development of another abscess 3 to 4 days ago.  She has not been seen by dermatology for this.  Denies any fevers or chills.  She has been using warm compresses and Epsom salt wipes and taking Ibuprofen for pain  ?  ? ?Home Medications ?Prior to Admission medications   ?Medication Sig Start Date End Date Taking? Authorizing Provider  ?doxycycline (VIBRAMYCIN) 100 MG capsule Take 1 capsule (100 mg total) by mouth 2 (two) times daily. 08/19/21  Yes Mare Ferrari, PA-C  ?traMADol (ULTRAM) 50 MG tablet Take 1 tablet (50 mg total) by mouth every 6 (six) hours as needed. 08/19/21  Yes Mare Ferrari, PA-C  ?sulfamethoxazole-trimethoprim (BACTRIM DS) 800-160 MG tablet Take 1 tablet by mouth 2 (two) times daily. 12/20/20   Menshew, Charlesetta Ivory, PA-C  ?   ? ?Allergies    ?Penicillins and Shellfish allergy   ? ?Review of Systems   ?Review of Systems ?Ten systems reviewed and are negative for acute change, except as noted in the HPI.  ? ?Physical Exam ?Updated Vital Signs ?BP 127/77   Pulse 76   Temp 98.1 ?F (36.7 ?C) (Oral)   Resp 18   LMP 07/30/2021 (Approximate)   SpO2 100%  ?Physical Exam ?Vitals and nursing note reviewed.  ?Constitutional:   ?   General: She is not in acute distress. ?   Appearance: She is well-developed.  ?HENT:  ?   Head: Normocephalic and atraumatic.  ?Eyes:  ?    Conjunctiva/sclera: Conjunctivae normal.  ?Cardiovascular:  ?   Rate and Rhythm: Normal rate and regular rhythm.  ?   Heart sounds: No murmur heard. ?Pulmonary:  ?   Effort: Pulmonary effort is normal. No respiratory distress.  ?   Breath sounds: Normal breath sounds.  ?Abdominal:  ?   Palpations: Abdomen is soft.  ?   Tenderness: There is no abdominal tenderness.  ?Musculoskeletal:     ?   General: No swelling.  ?   Cervical back: Neck supple.  ?Skin: ?   General: Skin is warm and dry.  ?   Capillary Refill: Capillary refill takes less than 2 seconds.  ?   Comments:  4 to 5 cm  Large abscesswith central induration to the right axilla.  No visible drainage.  Mild warmth, significantly tender to palpation.  ?Neurological:  ?   Mental Status: She is alert.  ?Psychiatric:     ?   Mood and Affect: Mood normal.  ? ? ?ED Results / Procedures / Treatments   ?Labs ?(all labs ordered are listed, but only abnormal results are displayed) ?Labs Reviewed - No data to display ? ?EKG ?None ? ?Radiology ?No results found. ? ?Procedures ?Marland Kitchen.Incision and Drainage ? ?Date/Time: 08/19/2021 10:09 AM ?Performed by: Mare Ferrari, PA-C ?Authorized by: Mare Ferrari,  PA-C  ? ?Consent:  ?  Consent obtained:  Verbal ?  Consent given by:  Patient ?  Risks discussed:  Bleeding, incomplete drainage, pain and damage to other organs ?  Alternatives discussed:  No treatment ?Universal protocol:  ?  Procedure explained and questions answered to patient or proxy's satisfaction: yes   ?  Relevant documents present and verified: yes   ?  Test results available : yes   ?  Imaging studies available: yes   ?  Required blood products, implants, devices, and special equipment available: yes   ?  Site/side marked: yes   ?  Immediately prior to procedure, a time out was called: yes   ?  Patient identity confirmed:  Verbally with patient ?Location:  ?  Type:  Abscess ?Pre-procedure details:  ?  Skin preparation:  Betadine ?Anesthesia:  ?  Anesthesia method:   Local infiltration ?  Local anesthetic:  Lidocaine 1% WITH epi ?Procedure type:  ?  Complexity:  Complex ?Procedure details:  ?  Incision types:  Single straight ?  Incision depth:  Subcutaneous ?  Wound management:  Probed and deloculated, irrigated with saline and extensive cleaning ?  Drainage:  Purulent and bloody ?  Drainage amount:  Scant ?  Packing materials:  None ?Post-procedure details:  ?  Procedure completion:  Tolerated well, no immediate complications  ? ? ?Medications Ordered in ED ?Medications  ?lidocaine-EPINEPHrine (XYLOCAINE W/EPI) 2 %-1:200000 (PF) injection 20 mL (has no administration in time range)  ? ? ?ED Course/ Medical Decision Making/ A&P ?  ?                        ?Medical Decision Making ?Risk ?Prescription drug management. ? ?36 year old female presented to the ER with concerns right axilla abscess.  Abscess does have some central induration, however does have some fluctuance to the borders.  She is afebrile, not tachycardic.  Nontoxic-appearing.  I&D performed however minimal drainage noted with scant bloody drainage.  Will discharge with Doxy, will send short course of tramadol for pain.  Patient was referred to dermatologist at [facility], but is requesting referral here to [facility] area which I am happy to provide.  I encouraged her to continue to take ibuprofen for pain and continue warm compress and Epsom salt soaks.  We discussed return precautions including worsening fevers, chills, redness, pain, etc.  She was understanding and is agreeable.  Stable for discharge. ? ?Final Clinical Impression(s) / ED Diagnoses ?Final diagnoses:  ?Abscess of axilla, right  ? ? ?Rx / DC Orders ?ED Discharge Orders   ? ?      Ordered  ?  doxycycline (VIBRAMYCIN) 100 MG capsule  2 times daily       ? 08/19/21 1014  ?  traMADol (ULTRAM) 50 MG tablet  Every 6 hours PRN       ? 08/19/21 1014  ? ?  ?  ? ?  ? ? ?  ?Mare Ferrari, PA-C ?08/19/21 1015 ? ?  ?Benjiman Core, MD ?08/20/21 [phone] ? ?

## 2021-08-19 NOTE — ED Triage Notes (Signed)
Pt reports recent abscess drained under right arm, has taken antibiotics as prescribed. Now has multiple bumps and increase in pain. Reports fever on Saturday.  ?

## 2021-08-19 NOTE — ED Notes (Addendum)
Pt has a large abscess approx the size of a plum in the right axilla. The abscess is erythematous at the top. Pt states it's painful to touch. No drainage noted, abscess intact. ?

## 2021-11-27 ENCOUNTER — Encounter (HOSPITAL_COMMUNITY): Payer: Self-pay | Admitting: Emergency Medicine

## 2021-11-27 ENCOUNTER — Emergency Department (HOSPITAL_COMMUNITY)
Admission: EM | Admit: 2021-11-27 | Discharge: 2021-11-27 | Discharge status: Against Medical Advice | Payer: Self-pay | Attending: Emergency Medicine | Admitting: Emergency Medicine

## 2021-11-27 DIAGNOSIS — Z5321 Procedure and treatment not carried out due to patient leaving prior to being seen by health care provider: Secondary | ICD-10-CM | POA: Insufficient documentation

## 2021-11-27 DIAGNOSIS — L02213 Cutaneous abscess of chest wall: Secondary | ICD-10-CM | POA: Insufficient documentation

## 2021-11-27 NOTE — ED Notes (Signed)
Patient left AMA. Pt encouraged to stay, but she declined.

## 2021-11-27 NOTE — ED Triage Notes (Signed)
Patient complains of abscess along the strap of her bra under her axilla that she first noticed on Sunday. Patient was prescribed doxycycline on Monday, now reports drainage from abscess. Patient is alert, oriented, ambulatory, and in no apparent distress at this time.

## 2021-11-27 NOTE — ED Provider Triage Note (Signed)
Emergency Medicine Provider Triage Evaluation Note  Catherine Castro , a 36 y.o. female  was evaluated in triage.  Pt complains of abscess to right chest wall.  Abscess started on Sunday and has gotten progressively bigger.  Patient reports that she was started on doxycycline medication.  Patient reports that today patient tried to mainly express fluid from the abscess.  Patient complains of pain associated to the area.  Review of Systems  Positive: Abscess Negative: Fever, chills  Physical Exam  BP (!) 175/107 (BP Location: Right Arm)   Pulse 92   Temp 98.3 F (36.8 C) (Oral)   Resp 16   SpO2 100%  Gen:   Awake, no distress   Resp:  Normal effort  MSK:   Moves extremities without difficulty  Other:  Abscess to right chest wall with noted fluctuance.  Surrounding erythema and induration.  Medical Decision Making  Medically screening exam initiated at 6:23 PM.  Appropriate orders placed.  Catherine Castro was informed that the remainder of the evaluation will be completed by another provider, this initial triage assessment does not replace that evaluation, and the importance of remaining in the ED until their evaluation is complete.     Haskel Schroeder, [place] 11/27/21 1824

## 2022-06-09 ENCOUNTER — Encounter (HOSPITAL_COMMUNITY): Payer: Self-pay | Admitting: Emergency Medicine

## 2022-06-09 ENCOUNTER — Ambulatory Visit (HOSPITAL_COMMUNITY)
Admission: EM | Admit: 2022-06-09 | Discharge: 2022-06-09 | Disposition: A | Discharge status: Home / Self Care | Payer: Medicaid Other | Attending: Emergency Medicine | Admitting: Emergency Medicine

## 2022-06-09 DIAGNOSIS — L02214 Cutaneous abscess of groin: Secondary | ICD-10-CM | POA: Diagnosis not present

## 2022-06-09 MED ORDER — IBUPROFEN 800 MG PO TABS: 800.0000 mg | ORAL_TABLET | Freq: Three times a day (TID) | ORAL | 0 refills | Status: AC | PRN

## 2022-06-09 MED ORDER — SULFAMETHOXAZOLE-TRIMETHOPRIM 800-160 MG PO TABS: 1.0000 | ORAL_TABLET | Freq: Two times a day (BID) | ORAL | 0 refills | Status: AC

## 2022-06-09 NOTE — ED Provider Notes (Signed)
[place]    CSN: [id number] Arrival date & time: 06/09/22  0945      History   Chief Complaint Chief Complaint  Patient presents with   Abscess    HPI Catherine Castro is a 37 y.o. female.  Presents with possible abscess in the left groin area Noticed it a few days ago, didn't have pain until yesterday 7/10 pain Reports it feels hard. No drainage.  Has used heat, ibuprofen   History of recurrent abscess. Last abx use was 4 months ago. Has been given doxy in the past  History reviewed. No pertinent past medical history.  There are no problems to display for this patient.   History reviewed. No pertinent surgical history.  OB History   No obstetric history on file.      Home Medications    Prior to Admission medications   Medication Sig Start Date End Date Taking? Authorizing Provider  ibuprofen (ADVIL) 800 MG tablet Take 1 tablet (800 mg total) by mouth 3 (three) times daily as needed. 06/09/22  Yes Katrice Goel, Wells Guiles, PA-C  sulfamethoxazole-trimethoprim (BACTRIM DS) 800-160 MG tablet Take 1 tablet by mouth 2 (two) times daily for 7 days. 06/09/22 06/16/22 Yes Marysa Wessner, Vernice Jefferson    Family History No family history on file.  Social History Social History   Tobacco Use   Smoking status: Former   Smokeless tobacco: Never  Substance Use Topics   Alcohol use: Not Currently   Drug use: Not Currently     Allergies   Penicillins and Shellfish allergy   Review of Systems Review of Systems As per HPI  Physical Exam Triage Vital Signs ED Triage Vitals  Enc Vitals Group     BP 06/09/22 1123 (!) 151/92     Pulse Rate 06/09/22 1123 81     Resp 06/09/22 1123 17     Temp 06/09/22 1123 97.9 F (36.6 C)     Temp Source 06/09/22 1123 Oral     SpO2 06/09/22 1123 99 %     Weight --      Height --      Head Circumference --      Peak Flow --      Pain Score 06/09/22 1121 7     Pain Loc --      Pain Edu? --      Excl. in [place]? --    No data  found.  Updated Vital Signs BP (!) 151/92 (BP Location: Right Arm)   Pulse 81   Temp 97.9 F (36.6 C) (Oral)   Resp 17   LMP 05/15/2022   SpO2 99%    Physical Exam Vitals and nursing note reviewed. Exam conducted with a chaperone present Flossie Dibble).  Constitutional:      General: She is not in acute distress. HENT:     Mouth/Throat:     Pharynx: Oropharynx is clear.  Cardiovascular:     Rate and Rhythm: Normal rate and regular rhythm.  Pulmonary:     Effort: Pulmonary effort is normal.  Genitourinary:    Comments: Small indurated soft area in the crease of left groin. Tender to light touch. Not fluctuant - below the skin. No drainage.  Skin:    General: Skin is warm and dry.     Findings: No bruising, erythema or rash.       Neurological:     Mental Status: She is alert and oriented to person, place, and time.     UC  Treatments / Results  Labs (all labs ordered are listed, but only abnormal results are displayed) Labs Reviewed - No data to display  EKG  Radiology No results found.  Procedures Procedures   Medications Ordered in UC Medications - No data to display  Initial Impression / Assessment and Plan / UC Course  I have reviewed the triage vital signs and the nursing notes.  Pertinent labs & imaging results that were available during my care of the patient were reviewed by me and considered in my medical decision making (see chart for details).  Skin abscess Will try bactrim BID x 7 days since she has been given doxy quite a lot. Patient asking for pain med, discussed use of tylenol and ibuprofen alternated. She has been given tramadol several times in the past for pain. PDMP reviewed.  Recommend to continue warm compress. Return precautions discussed. Patient agrees to plan  Final Clinical Impressions(s) / UC Diagnoses   Final diagnoses:  Abscess of left groin     Discharge Instructions      Please take medication as prescribed. Take with  food to avoid upset stomach. Finish the full course to treat infection!  You can alternate 800 mg ibuprofen with 650 mg tylenol every 4-6 hours for pain control. Continue warm compress as well.  Please return if no change in symptoms after 3-4 days on the antibiotics.     ED Prescriptions     Medication Sig Dispense Auth. Provider   sulfamethoxazole-trimethoprim (BACTRIM DS) 800-160 MG tablet Take 1 tablet by mouth 2 (two) times daily for 7 days. 14 tablet Abcde Oneil, PA-C   ibuprofen (ADVIL) 800 MG tablet Take 1 tablet (800 mg total) by mouth 3 (three) times daily as needed. 21 tablet Terina Mcelhinny, Wells Guiles, PA-C      I have reviewed the PDMP during this encounter.   Dreanna Kyllo, Wells Guiles, [place] 06/09/22 1226

## 2022-06-09 NOTE — ED Triage Notes (Signed)
Pt c/o abscess in left groin area for a couple days. Reports that area is hard and had no drainage. Reports pain started yesterday  Tried heat and ibuprofen.

## 2022-06-09 NOTE — Discharge Instructions (Addendum)
Please take medication as prescribed. Take with food to avoid upset stomach. Finish the full course to treat infection!  You can alternate 800 mg ibuprofen with 650 mg tylenol every 4-6 hours for pain control. Continue warm compress as well.  Please return if no change in symptoms after 3-4 days on the antibiotics.

## 2022-06-09 NOTE — ED Notes (Addendum)
Requested Rising , PA sent ibuprofen script to pharmacy at patient's request.

## 2022-08-08 ENCOUNTER — Ambulatory Visit (INDEPENDENT_AMBULATORY_CARE_PROVIDER_SITE_OTHER): Payer: Medicaid Other

## 2022-08-08 ENCOUNTER — Ambulatory Visit (HOSPITAL_COMMUNITY)
Admission: EM | Admit: 2022-08-08 | Discharge: 2022-08-08 | Disposition: A | Discharge status: Home / Self Care | Payer: Medicaid Other

## 2022-08-08 ENCOUNTER — Encounter (HOSPITAL_COMMUNITY): Payer: Self-pay | Admitting: *Deleted

## 2022-08-08 DIAGNOSIS — M25572 Pain in left ankle and joints of left foot: Secondary | ICD-10-CM

## 2022-08-08 DIAGNOSIS — M25775 Osteophyte, left foot: Secondary | ICD-10-CM

## 2022-08-08 DIAGNOSIS — M79672 Pain in left foot: Secondary | ICD-10-CM

## 2022-08-08 MED ORDER — METHYLPREDNISOLONE 4 MG PO TABS: ORAL_TABLET | ORAL | 0 refills | Status: AC

## 2022-08-08 NOTE — ED Provider Notes (Signed)
[place]    CSN: [id number] Arrival date & time: 08/08/22  [id number]    HISTORY   Chief Complaint  Patient presents with   Ankle Pain   HPI Catherine Castro is a pleasant, 37 y.o. female who presents to urgent care today. Pt complains of recurrent left foot pain X 1 week.  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  Patient states that since current symptoms began, she has been soaking her foot in warm water with Epsom salts, wearing an ankle sleeve and trying to wear more supportive shoes, states has not helped.   The history is provided by the patient.   History reviewed. No pertinent past medical history. There are no problems to display for this patient.  History reviewed. No pertinent surgical history. OB History   No obstetric history on file.    Home Medications    Prior to Admission medications   Medication Sig Start Date End Date Taking? Authorizing Provider  albuterol (VENTOLIN HFA) 108 (90 Base) MCG/ACT inhaler Inhale into the lungs. 10/24/20  Yes [provider]  butalbital-acetaminophen-caffeine (FIORICET) 50-325-40 MG tablet Take 1  tablet every 8 hours as needed for headache. Do not exceed 6 tablets/day. 09/02/21  Yes [provider]  chlorhexidine (HIBICLENS) 4 % external liquid Use as directed 2 times per week. D/c previous rx for this. 09/02/21  Yes [provider]  clindamycin (CLEOCIN T) 1 % external solution Apply topically 2 (two) times daily. 09/02/21  Yes [provider]  fluticasone (FLOVENT HFA) 110 MCG/ACT inhaler Inhale into the lungs. 09/02/21  Yes [provider]  ibuprofen (ADVIL) 800 MG tablet Take 1 tablet (800 mg total) by mouth 3 (three) times daily as needed. 06/09/22  Yes Rising, Vernice Jefferson    Family History History reviewed. No pertinent family history. Social History Social History   Tobacco Use   Smoking  status: Some Days    Types: Cigarettes   Smokeless tobacco: Never  Vaping Use   Vaping Use: Never used  Substance Use Topics   Alcohol use: Not Currently   Drug use: Not Currently   Allergies   Penicillins and Shellfish allergy  Review of Systems Review of Systems Pertinent findings revealed after performing a 14 point review of systems has been noted in the history of present illness.  Physical Exam Vital Signs BP 127/87 (BP Location: Right Arm)   Pulse 87   Temp 98.4 F (36.9 C) (Oral)   Resp 18   LMP 08/05/2022 (Exact Date)   SpO2 98%   No data found.  Physical Exam Vitals and nursing note reviewed.  Constitutional:      General: She is not in acute distress.    Appearance: Normal appearance.  HENT:     Head: Normocephalic and atraumatic.  Eyes:     Pupils: Pupils are equal, round, and reactive to light.  Cardiovascular:     Rate and Rhythm: Normal rate and regular rhythm.  Pulmonary:     Effort: Pulmonary effort is normal.     Breath sounds: Normal breath sounds.  Musculoskeletal:        General: Normal range of motion.     Cervical back: Normal range of motion and neck supple.     Left foot: Normal range of motion and normal capillary refill. Swelling and tenderness present. No deformity, bony tenderness or crepitus. Normal pulse.  Skin:  General: Skin is warm and dry.  Neurological:     General: No focal deficit present.     Mental Status: She is alert and oriented to person, place, and time. Mental status is at baseline.  Psychiatric:        Mood and Affect: Mood normal.        Behavior: Behavior normal.        Thought Content: Thought content normal.        Judgment: Judgment normal.     UC Couse / Diagnostics / Procedures:     Radiology Study Result CLINICAL DATA: Left ankle pain, swelling at medial heel and arch  EXAM: LEFT FOOT - COMPLETE 3+ VIEW  COMPARISON: 10/18/2018  FINDINGS: No acute fracture or dislocation.The joint spaces  are preserved.Plantar and calcaneal enthesophytes. No significant soft tissue abnormality or foreign body.  IMPRESSION: No acute osseous abnormality.   Electronically Signed By: Merilyn Baba M.D. On: 08/08/2022 11:43  Procedures Procedures (including critical care time) EKG  Pending results:  Labs Reviewed - No data to display  Medications Ordered in UC: Medications - No data to display  UC Diagnoses / Final Clinical Impressions(s)   I have reviewed the triage vital signs and the nursing notes.  Pertinent labs & imaging results that were available during my care of the patient were reviewed by me and considered in my medical decision making (see chart for details).    Final diagnoses:  Osteophyte of left foot  Left foot pain   Patient advised of x-ray findings. Begin tapering dose of methylprednisolone Patient provided with a new cam boot.  Patient advised to follow-up with previous foot care provider.  Please see discharge instructions below for details of plan of care as provided to patient. ED Prescriptions     Medication Sig Dispense Auth. Provider   methylPREDNISolone (MEDROL) 4 MG tablet Take 8 tablets (32 mg total) by mouth daily for 1 day, THEN 7 tablets (28 mg total) daily for 1 day, THEN 6 tablets (24 mg total) daily for 1 day, THEN 5 tablets (20 mg total) daily for 1 day, THEN 4 tablets (16 mg total) daily for 1 day, THEN 3 tablets (12 mg total) daily for 1 day, THEN 2 tablets (8 mg total) daily for 1 day, THEN 1 tablet (4 mg total) daily for 1 day. 36 tablet Lynden Oxford Scales, PA-C      PDMP not reviewed this encounter.  Discharge Instructions:   Discharge Instructions      Per my personal read of your foot x-rays, the osteophytes are still present but look better than they did in 2020.  This is good news.    If you continue using your cam boot, wear appropriate shoes and complete a course of oral steroids I believe that we can get your pain  pretty well-controlled.  I also strongly recommend that you consider following up with physical therapy once again.  Thank you for visiting urgent care today.    Disposition Upon Discharge:  Condition: stable for discharge home Home: take medications as prescribed; routine discharge instructions as discussed; follow up as advised.  Patient presented with an acute illness with associated systemic symptoms and significant discomfort requiring urgent management. In my opinion, this is a condition that a prudent lay person (someone who possesses an average knowledge of health and medicine) may potentially expect to result in complications if not addressed urgently such as respiratory distress, impairment of bodily function or dysfunction of bodily organs.  Routine symptom specific, illness specific and/or disease specific instructions were discussed with the patient and/or caregiver at length.   As such, the patient has been evaluated and assessed, work-up was performed and treatment was provided in alignment with urgent care protocols and evidence based medicine.  Patient/parent/caregiver has been advised that the patient may require follow up for further testing and treatment if the symptoms continue in spite of treatment, as clinically indicated and appropriate.  Patient/parent/caregiver has been advised to report to orthopedic urgent care clinic or return to the [facility] or PCP in 3-5 days if no better; follow-up with orthopedics, PCP or the Emergency Department if new signs and symptoms develop or if the current signs or symptoms continue to change or worsen for further workup, evaluation and treatment as clinically indicated and appropriate  The patient will follow up with their current PCP if and as advised. If the patient does not currently have a PCP we will have assisted them in obtaining one.   The patient may need specialty follow up if the symptoms continue, in spite of conservative  treatment and management, for further workup, evaluation, consultation and treatment as clinically indicated and appropriate.  Patient/parent/caregiver verbalized understanding and agreement of plan as discussed.  All questions were addressed during visit.  Please see discharge instructions below for further details of plan.  This office note has been dictated using Museum/gallery curator.  Unfortunately, this method of dictation can sometimes lead to typographical or grammatical errors.  I apologize for your inconvenience in advance if this occurs.  Please do not hesitate to reach out to me if clarification is needed.      Lynden Oxford Scales, [place] 08/11/22 [phone]

## 2022-08-08 NOTE — ED Triage Notes (Signed)
Pt states she has left ankle pain X 1 week she states that this has happened before and she needed a boot. She has been soaking her foot, wearing a ankle sleeve and more supportive shoes.

## 2022-08-08 NOTE — Discharge Instructions (Addendum)
Per my personal read of your foot x-rays, the osteophytes are still present but look better than they did in 2020.  This is good news.    If you continue using your cam boot, wear appropriate shoes and complete a course of oral steroids I believe that we can get your pain pretty well-controlled.  I also strongly recommend that you consider following up with physical therapy once again.  Thank you for visiting urgent care today.

## 2022-09-17 ENCOUNTER — Encounter (HOSPITAL_COMMUNITY): Payer: Self-pay

## 2022-09-17 ENCOUNTER — Ambulatory Visit (HOSPITAL_COMMUNITY)
Admission: EM | Admit: 2022-09-17 | Discharge: 2022-09-17 | Disposition: A | Discharge status: Home / Self Care | Payer: Medicaid Other | Attending: Family Medicine | Admitting: Family Medicine

## 2022-09-17 DIAGNOSIS — L02411 Cutaneous abscess of right axilla: Secondary | ICD-10-CM

## 2022-09-17 MED ORDER — LIDOCAINE HCL (PF) 1 % IJ SOLN
INTRAMUSCULAR | Status: AC
  Filled 2022-09-17: qty 30

## 2022-09-17 MED ORDER — HYDROCODONE-ACETAMINOPHEN 5-325 MG PO TABS: 1.0000 | ORAL_TABLET | Freq: Four times a day (QID) | ORAL | 0 refills | Status: DC | PRN

## 2022-09-17 NOTE — ED Triage Notes (Signed)
Patient here today with an inflamed area on her right underarm since Sunday. Patient has had abscesses in the past and received a prescription for a month supply of Doxycycline for preventive purposes. Patient started taking this on Sunday when she noticed it but it just keeps getting bigger.

## 2022-09-17 NOTE — Discharge Instructions (Addendum)
Be aware, you have been prescribed pain medications that may cause drowsiness. While taking this medication, do not take any other medications containing acetaminophen (Tylenol). Do not combine with alcohol or recreational drugs. Please do not drive, operate heavy machinery, or take part in activities that require making important decisions while on this medication as your judgement may be clouded.  Your blood pressure was noted to be elevated during your visit today. If you are currently taking medication for high blood pressure, please ensure you are taking this as directed. If you do not have a history of high blood pressure and your blood pressure remains persistently elevated, you may need to begin taking a medication at some point. You may return here within the next few days to recheck if unable to see your primary care provider or if you do not have a one.  BP (!) 166/114 (BP Location: Left Arm)   Pulse 87   Temp 98.3 F (36.8 C) (Oral)   Resp 16   Ht 5\' 1"  (1.549 m)   Wt 124.3 kg   LMP 08/27/2022 (Approximate)   SpO2 99%   BMI 51.77 kg/m   BP Readings from Last 3 Encounters:  09/17/22 (!) 166/114  08/08/22 127/87  06/09/22 (!) 151/92

## 2022-09-20 NOTE — ED Provider Notes (Signed)
  [facility]   [id number] 09/17/22 Arrival Time: 1935  ASSESSMENT & PLAN:  1. Abscess of axilla, right     Incision and Drainage Procedure Note  Anesthesia: 1% plain lidocaine  Procedure Details  The procedure, risks and complications have been discussed in detail (including, but not limited to pain and bleeding) with the patient.  The skin induration was prepped and draped in the usual fashion. After adequate local anesthesia, I&D with a #11 blade was performed on the right axilla with moderate, bloody, purulent drainage.  EBL: minimal Drains: none Packing: 1/4 iod placed Condition: Tolerated procedure well Complications: none.  Meds ordered this encounter  Medications   HYDROcodone-acetaminophen (NORCO/VICODIN) 5-325 MG tablet    Sig: Take 1 tablet by mouth every 6 (six) hours as needed for moderate pain or severe pain.    Dispense:  8 tablet    Refill:  0    Wound care instructions discussed and given in written format. To return in 48 hours for wound check and packing removal..  Finish all antibiotics. OTC analgesics as needed.  Reviewed expectations re: course of current medical issues. Questions answered. Outlined signs and symptoms indicating need for more acute intervention. Patient verbalized understanding. After Visit Summary given.   SUBJECTIVE:  Catherine Castro is a 37 y.o. female who presents with a possible infection of her R axilla; sev days; getting more painful. Is not draining. Denies fever. No tx PTA. H/O similar.   OBJECTIVE:  Vitals:   09/17/22 1955 09/17/22 1956  BP:  (!) 166/114  Pulse:  87  Resp:  16  Temp:  98.3 F (36.8 C)  TempSrc:  Oral  SpO2:  99%  Weight: 124.3 kg   Height: 5\' 1"  (1.549 m)      General appearance: alert; no distress RUE: approx 2x1.5 cm induration of her R axilla; tender to touch; no active drainage or bleeding Psychological: alert and cooperative; normal mood and affect  Allergies  Allergen  Reactions   Penicillins Rash   Shellfish Allergy Rash    History reviewed. No pertinent past medical history. Social History   Socioeconomic History   Marital status: Single    Spouse name: Not on file   Number of children: Not on file   Years of education: Not on file   Highest education level: Not on file  Occupational History   Not on file  Tobacco Use   Smoking status: Some Days    Types: Cigarettes   Smokeless tobacco: Never  Vaping Use   Vaping Use: Never used  Substance and Sexual Activity   Alcohol use: Not Currently   Drug use: Not Currently   Sexual activity: Yes    Birth control/protection: None  Other Topics Concern   Not on file  Social History Narrative   Not on file   Social Determinants of Health   Financial Resource Strain: Not on file  Food Insecurity: Not on file  Transportation Needs: Not on file  Physical Activity: Not on file  Stress: Not on file  Social Connections: Not on file   History reviewed. No pertinent family history. History reviewed. No pertinent surgical history.          Mardella Layman, MD 09/20/22 [phone]

## 2023-06-03 ENCOUNTER — Ambulatory Visit: Payer: Self-pay

## 2023-06-07 ENCOUNTER — Ambulatory Visit
Admission: EM | Admit: 2023-06-07 | Discharge: 2023-06-07 | Disposition: A | Discharge status: Home / Self Care | Payer: Medicaid Other | Attending: Emergency Medicine | Admitting: Emergency Medicine

## 2023-06-07 ENCOUNTER — Other Ambulatory Visit: Payer: Self-pay

## 2023-06-07 DIAGNOSIS — K5900 Constipation, unspecified: Secondary | ICD-10-CM

## 2023-06-07 DIAGNOSIS — R1084 Generalized abdominal pain: Secondary | ICD-10-CM

## 2023-06-07 MED ORDER — LACTULOSE 10 GM/15ML PO SOLN: 10.0000 g | Freq: Every day | ORAL | 0 refills | Status: AC | PRN

## 2023-06-07 NOTE — Discharge Instructions (Signed)
You were evaluated for abdominal pain and constipation, on exam the abdomen is still soft and I am able to hear bowel sounds therefore we have held off on emergency department evaluation  Attempt use of lactulose once daily for up to 3 days, once full bowel movement has occurred, stop use of medicine  This medicine typically will cause frequent and runny bowel movements therefore ensure that you have access to a bathroom after use  At any point if symptoms worsen such as severe abdominal pain, vomiting, firmness or hardness to your abdomen or you begin to have a fever please go to the nearest emergency department for evaluation  If you have not had a bowel movement by Tuesday which would be 3 uses of the medication please go to the nearest emergency department as there is concern for blockage

## 2023-06-07 NOTE — ED Triage Notes (Signed)
Arrives with complaints of worsening abdominal discomfort related to constipation x3 days. Patient states that the constipation may be related to her Wegovy injection. She has tried OTC remedies with minimal relief.

## 2023-06-07 NOTE — ED Provider Notes (Signed)
[facility]    CSN: [id number] Arrival date & time: 06/07/23  1315      History   Chief Complaint Chief Complaint  Patient presents with   Constipation    HPI MAARI VALENCIA is a 38 y.o. female.   Patient presents for evaluation of generalized abdominal pain progressively worsening over the past 3 days.  Endorses that she believes that she is constipated, known history, has not defecated in 3 to 4 days.  Has attempted use of [employer] brand laxative, organic smooth move tea and Epsom salt which have been ineffective, typically work when this occurs.  Unable to tolerate foods but tolerating water.  Decreased gas production.  Denies nausea or vomiting.  No past medical history on file.  There are no active problems to display for this patient.   No past surgical history on file.  OB History   No obstetric history on file.      Home Medications    Prior to Admission medications   Medication Sig Start Date End Date Taking? Authorizing Provider  lactulose (CHRONULAC) 10 GM/15ML solution Take 15 mLs (10 g total) by mouth daily as needed for mild constipation. 06/07/23  Yes Derita Michelsen R, NP  albuterol (VENTOLIN HFA) 108 (90 Base) MCG/ACT inhaler Inhale into the lungs. 10/24/20   [provider]  butalbital-acetaminophen-caffeine (FIORICET) 50-325-40 MG tablet Take 1  tablet every 8 hours as needed for headache. Do not exceed 6 tablets/day. 09/02/21   [provider]  fluticasone (FLOVENT HFA) 110 MCG/ACT inhaler Inhale into the lungs. 09/02/21   [provider]  HYDROcodone-acetaminophen (NORCO/VICODIN) 5-325 MG tablet Take 1 tablet by mouth every 6 (six) hours as needed for moderate pain or severe pain. 09/17/22   Mardella Layman, MD  ibuprofen (ADVIL) 800 MG tablet Take 1 tablet (800 mg total) by mouth 3 (three) times daily as needed. 06/09/22   Rising, Lurena Joiner PA-C    Family History No family history on file.  Social History Social History    Tobacco Use   Smoking status: Some Days    Types: Cigarettes   Smokeless tobacco: Never  Vaping Use   Vaping status: Never Used  Substance Use Topics   Alcohol use: Not Currently   Drug use: Not Currently     Allergies   Latex, Penicillins, and Shellfish allergy   Review of Systems Review of Systems   Physical Exam Triage Vital Signs ED Triage Vitals  Encounter Vitals Group     BP 06/07/23 1405 134/88     Systolic BP Percentile --      Diastolic BP Percentile --      Pulse Rate 06/07/23 1405 94     Resp 06/07/23 1405 20     Temp 06/07/23 1405 98.6 F (37 C)     Temp Source 06/07/23 1405 Oral     SpO2 06/07/23 1405 100 %     Weight --      Height --      Head Circumference --      Peak Flow --      Pain Score 06/07/23 1404 10     Pain Loc --      Pain Education --      Exclude from Growth Chart --    No data found.  Updated Vital Signs BP 134/88 (BP Location: Right Arm)   Pulse 94   Temp 98.6 F (37 C) (Oral)   Resp 20   SpO2 100%   Visual  Acuity Right Eye Distance:   Left Eye Distance:   Bilateral Distance:    Right Eye Near:   Left Eye Near:    Bilateral Near:     Physical Exam Constitutional:      Appearance: Normal appearance.  Eyes:     Extraocular Movements: Extraocular movements intact.  Abdominal:     General: Abdomen is flat. Bowel sounds are normal.     Palpations: Abdomen is soft.     Tenderness: There is generalized abdominal tenderness.  Neurological:     Mental Status: She is alert and oriented to person, place, and time.      UC Treatments / Results  Labs (all labs ordered are listed, but only abnormal results are displayed) Labs Reviewed - No data to display  EKG   Radiology No results found.  Procedures Procedures (including critical care time)  Medications Ordered in UC Medications - No data to display  Initial Impression / Assessment and Plan / UC Course  I have reviewed the triage vital signs and the  nursing notes.  Pertinent labs & imaging results that were available during my care of the patient were reviewed by me and considered in my medical decision making (see chart for details).  Generalized abdominal pain, constipation  Vitals are stable and while visibly uncomfortable patient is in no signs of distress nontoxic-appearing, generalized tenderness noted to the abdominal region, abdomen is soft and flat for weight, at this time stable, prescribed lactulose that she has tried several over-the-counter medications without relief, advised to use for up to 3 days and given strict precautions for any worsening symptoms or lack of a bowel movement for 3 days that she is to go to the nearest emergency department for evaluation, verbalized understanding Final Clinical Impressions(s) / UC Diagnoses   Final diagnoses:  Generalized abdominal pain  Constipation, unspecified constipation type     Discharge Instructions      You were evaluated for abdominal pain and constipation, on exam the abdomen is still soft and I am able to hear bowel sounds therefore we have held off on emergency department evaluation  Attempt use of lactulose once daily for up to 3 days, once full bowel movement has occurred, stop use of medicine  This medicine typically will cause frequent and runny bowel movements therefore ensure that you have access to a bathroom after use  At any point if symptoms worsen such as severe abdominal pain, vomiting, firmness or hardness to your abdomen or you begin to have a fever please go to the nearest emergency department for evaluation  If you have not had a bowel movement by Tuesday which would be 3 uses of the medication please go to the nearest emergency department as there is concern for blockage   ED Prescriptions     Medication Sig Dispense Auth. Provider   lactulose (CHRONULAC) 10 GM/15ML solution Take 15 mLs (10 g total) by mouth daily as needed for mild constipation.  236 mL Savahna Casados, Elita Boone, NP      PDMP not reviewed this encounter.   Valinda Hoar, NP 06/07/23 1428

## 2023-06-08 ENCOUNTER — Emergency Department
Admission: EM | Admit: 2023-06-08 | Discharge: 2023-06-08 | Disposition: A | Discharge status: Home / Self Care | Payer: Medicaid Other | Attending: Emergency Medicine | Admitting: Emergency Medicine

## 2023-06-08 ENCOUNTER — Emergency Department: Payer: Medicaid Other

## 2023-06-08 DIAGNOSIS — K5903 Drug induced constipation: Secondary | ICD-10-CM | POA: Diagnosis not present

## 2023-06-08 DIAGNOSIS — K5900 Constipation, unspecified: Secondary | ICD-10-CM | POA: Diagnosis present

## 2023-06-08 DIAGNOSIS — T50995A Adverse effect of other drugs, medicaments and biological substances, initial encounter: Secondary | ICD-10-CM | POA: Diagnosis not present

## 2023-06-08 DIAGNOSIS — T887XXA Unspecified adverse effect of drug or medicament, initial encounter: Secondary | ICD-10-CM

## 2023-06-08 MED ORDER — MAGNESIUM CITRATE PO SOLN
1.0000 | Freq: Once | ORAL | 1 refills | Status: AC
Start: 2023-06-08 — End: 2023-06-08

## 2023-06-08 MED ORDER — BISACODYL 10 MG RE SUPP: 10.0000 mg | RECTAL | 0 refills | Status: AC | PRN

## 2023-06-08 NOTE — ED Notes (Signed)
Pt states constipation for week now. Pt states she started new diet med 3 weeks ago and not sure if that is causing this. Pt states no relief with any OTc meds. Pt states she isnt even passing gas.

## 2023-06-08 NOTE — ED Triage Notes (Signed)
Pt to ED for constipation x1 week, reports passing some liquid.

## 2023-06-08 NOTE — ED Provider Notes (Signed)
[facility] Provider Note    Event Date/Time   First MD Initiated Contact with Patient 06/08/23 [phone]     (approximate)   History   Constipation  HPI  Catherine Castro is a 38 y.o. female with no significant past medical history who presents with complaints of constipation, patient reports that she started [facility] 3 weeks ago, over the last week, little stool output, decreased p.o. intake.  Abdominal cramping which is intermittent.  No history of abdominal surgery     Physical Exam   Triage Vital Signs: ED Triage Vitals  Encounter Vitals Group     BP 06/08/23 0802 121/79     Systolic BP Percentile --      Diastolic BP Percentile --      Pulse Rate 06/08/23 0802 (!) 104     Resp 06/08/23 0802 18     Temp 06/08/23 0802 98.4 F (36.9 C)     Temp Source 06/08/23 0802 Oral     SpO2 06/08/23 0802 96 %     Weight 06/08/23 0800 124.7 kg (275 lb)     Height 06/08/23 0800 1.549 m (5\' 1" )     Head Circumference --      Peak Flow --      Pain Score 06/08/23 0801 8     Pain Loc --      Pain Education --      Exclude from Growth Chart --     Most recent vital signs: Vitals:   06/08/23 0802  BP: 121/79  Pulse: (!) 104  Resp: 18  Temp: 98.4 F (36.9 C)  SpO2: 96%     General: Awake, no distress.  CV:  Good peripheral perfusion.  Resp:  Normal effort.  Abd:  No distention.  Soft, nontender Other:     ED Results / Procedures / Treatments   Labs (all labs ordered are listed, but only abnormal results are displayed) Labs Reviewed - No data to display   EKG     RADIOLOGY KUB viewed interpret by me, no acute abnormality    PROCEDURES:  Critical Care performed:   Procedures   MEDICATIONS ORDERED IN ED: Medications - No data to display   IMPRESSION / MDM / ASSESSMENT AND PLAN / ED COURSE  I reviewed the triage vital signs and the nursing notes. Patient's presentation is most consistent with acute illness / injury with system  symptoms.  Patient presents with constipation as detailed above, reassuring abdominal exam, KUB is unremarkable.  Overall patient is well-appearing and in no acute distress.  No nausea or vomiting  Intermittent abdominal cramping is consistent with constipation, likely side effect from [facility]  Will add on Dulcolax suppositories, magnesium citrate, no indication for admission at this time, return precautions discussed        FINAL CLINICAL IMPRESSION(S) / ED DIAGNOSES   Final diagnoses:  Constipation, unspecified constipation type  Medication side effect     Rx / DC Orders   ED Discharge Orders          Ordered    bisacodyl (DULCOLAX) 10 MG suppository  As needed        06/08/23 1029    magnesium citrate SOLN   Once        06/08/23 1029             Note:  This document was prepared using Dragon voice recognition software and may include unintentional dictation errors.   Jene Every, MD 06/08/23 1034

## 2023-07-29 ENCOUNTER — Encounter: Payer: Self-pay | Admitting: Emergency Medicine

## 2023-07-29 ENCOUNTER — Emergency Department
Admission: EM | Admit: 2023-07-29 | Discharge: 2023-07-29 | Disposition: A | Discharge status: Home / Self Care | Attending: Emergency Medicine | Admitting: Emergency Medicine

## 2023-07-29 ENCOUNTER — Other Ambulatory Visit: Payer: Self-pay

## 2023-07-29 DIAGNOSIS — N9089 Other specified noninflammatory disorders of vulva and perineum: Secondary | ICD-10-CM | POA: Diagnosis present

## 2023-07-29 MED ORDER — SULFAMETHOXAZOLE-TRIMETHOPRIM 800-160 MG PO TABS
1.0000 | ORAL_TABLET | Freq: Two times a day (BID) | ORAL | 0 refills | Status: AC
Start: 2023-07-29 — End: ?

## 2023-07-29 MED ORDER — HYDROCODONE-ACETAMINOPHEN 5-325 MG PO TABS: 1.0000 | ORAL_TABLET | Freq: Four times a day (QID) | ORAL | 0 refills | Status: AC | PRN

## 2023-07-29 NOTE — ED Provider Notes (Signed)
 [facility] Provider Note    Event Date/Time   First MD Initiated Contact with Patient 07/29/23 [phone]     (approximate)   History   Abscess   HPI  Catherine Castro is a 38 y.o. female  with no specific past medical history  and as listed in EMR presents to the emergency department for treatment and evaluation of  vaginal abscess ongoing since Friday. She has not noticed any drainage. She denies fever.       Physical Exam   Triage Vital Signs: ED Triage Vitals  Encounter Vitals Group     BP 07/29/23 0706 130/79     Systolic BP Percentile --      Diastolic BP Percentile --      Pulse Rate 07/29/23 0706 78     Resp 07/29/23 0706 18     Temp 07/29/23 0706 97.8 F (36.6 C)     Temp Source 07/29/23 0706 Oral     SpO2 07/29/23 0706 100 %     Weight 07/29/23 0705 273 lb 5.9 oz (124 kg)     Height 07/29/23 0705 5\' 1"  (1.549 m)     Head Circumference --      Peak Flow --      Pain Score 07/29/23 0705 0     Pain Loc --      Pain Education --      Exclude from Growth Chart --     Most recent vital signs: Vitals:   07/29/23 0706  BP: 130/79  Pulse: 78  Resp: 18  Temp: 97.8 F (36.6 C)  SpO2: 100%    General: Awake, no distress.  CV:  Good peripheral perfusion.  Resp:  Normal effort.  Abd:  No distention.  Other:  Tender lesion on right labia majora at skin fold is without erythema, fluctuance, or induration. Area is approximately 1cm x 0.5cm in size.   ED Results / Procedures / Treatments   Labs (all labs ordered are listed, but only abnormal results are displayed) Labs Reviewed - No data to display   EKG  Not indicated.   RADIOLOGY  Image and radiology report reviewed and interpreted by me. Radiology report consistent with the same.  Not indicated.  PROCEDURES:  Critical Care performed: No  Procedures   MEDICATIONS ORDERED IN ED:  Medications - No data to display   IMPRESSION / MDM / ASSESSMENT AND PLAN / ED COURSE    I have reviewed the triage note.  Differential diagnosis includes, but is not limited to, labial abscess, genital herpes, genital wart, hydradenitis  Patient's presentation is most consistent with acute, uncomplicated illness.  38 year old female presenting to the emergency department for evaluation of a labial lesion.  See HPI for further details.  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  On exam, she does have a small, raised, nonfluctuant area that is without erythema. No ulceration to indicate HSV 2. We discussed option to try I&D, but I advised her there was no guarantee that it would drain based on exam, or trial antibiotic. She chose antibiotic trial.   If area does not improve/resolve she is to follow up with PCP or GYN. If symptoms worsen and she is unable to schedule an appointment, she is to return to the ER.      FINAL CLINICAL IMPRESSION(S) / ED DIAGNOSES   Final diagnoses:  Labial lesion  Rx / DC Orders   ED Discharge Orders          Ordered    sulfamethoxazole-trimethoprim (BACTRIM DS) 800-160 MG tablet  2 times daily        07/29/23 0754    HYDROcodone-acetaminophen (NORCO/VICODIN) 5-325 MG tablet  Every 6 hours PRN        07/29/23 0754             Note:  This document was prepared using Dragon voice recognition software and may include unintentional dictation errors.   Chinita Pester, FNP 08/01/23 1907    Merwyn Katos, MD 08/10/23 [phone]

## 2023-07-29 NOTE — ED Triage Notes (Signed)
 Patient to ED via POV for vaginal abscess. Ongoing since Friday. States hx of same. Hurts worse when moving.

## 2023-07-29 NOTE — Discharge Instructions (Signed)
 Warm soak or compress 3 or 4 times per day.  Follow up with primary care or the gynecologist if not improving over the next couple of days.  Return to the ER for symptoms that change or worsen if unable to schedule an appointment.

## 2024-04-04 ENCOUNTER — Emergency Department
Admission: EM | Admit: 2024-04-04 | Discharge: 2024-04-04 | Disposition: A | Discharge status: Home / Self Care | Payer: Self-pay | Attending: Emergency Medicine | Admitting: Emergency Medicine

## 2024-04-04 DIAGNOSIS — L02213 Cutaneous abscess of chest wall: Secondary | ICD-10-CM | POA: Insufficient documentation

## 2024-04-04 MED ORDER — HYDROCODONE-ACETAMINOPHEN 5-325 MG PO TABS: 1.0000 | ORAL_TABLET | Freq: Four times a day (QID) | ORAL | 0 refills | Status: AC | PRN

## 2024-04-04 MED ORDER — SULFAMETHOXAZOLE-TRIMETHOPRIM 800-160 MG PO TABS: 1.0000 | ORAL_TABLET | Freq: Two times a day (BID) | ORAL | 0 refills | Status: AC

## 2024-04-04 MED ORDER — LIDOCAINE HCL (PF) 1 % IJ SOLN
5.0000 mL | Freq: Once | INTRAMUSCULAR | Status: AC
  Administered 2024-04-04: 5 mL
  Filled 2024-04-04: qty 5

## 2024-04-04 NOTE — ED Provider Notes (Signed)
 [facility] Provider Note    Event Date/Time   First MD Initiated Contact with Patient 04/04/24 0701     (approximate)   History   Abscess   HPI  Catherine Castro is a 38 year old female with history of hidradenitis presenting to the emergency department for evaluation with concern for abscess.  Patient reports that on Thursday she began to notice some swelling over her chest wall between her breast.  Reports has had abscesses in this location before.  Used clindamycin  ointment to bring it to a head and attempted to drain it at home with moderate return of purulent material, but reports that it is still painful and swollen.  No fevers.  Reports history of multiple prior abscesses related to at bedtime previously requiring I&D.     Physical Exam   Triage Vital Signs: ED Triage Vitals  Encounter Vitals Group     BP 04/04/24 0704 130/80     Girls Systolic BP Percentile --      Girls Diastolic BP Percentile --      Boys Systolic BP Percentile --      Boys Diastolic BP Percentile --      Pulse Rate 04/04/24 0704 77     Resp 04/04/24 0704 17     Temp 04/04/24 0704 98.3 F (36.8 C)     Temp Source 04/04/24 0704 Oral     SpO2 04/04/24 0704 100 %     Weight 04/04/24 0659 263 lb (119.3 kg)     Height 04/04/24 0659 5' 1 (1.549 m)     Head Circumference --      Peak Flow --      Pain Score 04/04/24 0659 0     Pain Loc --      Pain Education --      Exclude from Growth Chart --     Most recent vital signs: Vitals:   04/04/24 0704  BP: 130/80  Pulse: 77  Resp: 17  Temp: 98.3 F (36.8 C)  SpO2: 100%     General: Awake, interactive  CV:  Good peripheral perfusion Resp:  Unlabored respirations Abd:  Nondistended.  Neuro:  Symmetric facial movement, fluid speech Other:   There is an area of swelling on the chest wall overlying the sternum and between the breasts.  There is a small amount of surrounding erythema.  There is a pustule without active  drainage noted.  Under ultrasound guidance there is a small superficial abscess under 1 cm deep noted without associated vascularity.     ED Results / Procedures / Treatments   Labs (all labs ordered are listed, but only abnormal results are displayed) Labs Reviewed - No data to display   EKG EKG independently reviewed and interpreted by myself demonstrates:    RADIOLOGY Imaging independently reviewed and interpreted by myself demonstrates:   Formal Radiology Read:  No results found.  PROCEDURES:  Critical Care performed: No  .Incision and Drainage  Date/Time: 04/04/2024 7:58 AM  Performed by: Levander Slate, MD Authorized by: Levander Slate, MD   Consent:    Consent obtained:  Verbal   Consent given by:  Patient   Risks, benefits, and alternatives were discussed: yes   Location:    Type:  Abscess   Location:  Trunk   Trunk location:  Chest Pre-procedure details:    Skin preparation:  Chlorhexidine Sedation:    Sedation type:  None Anesthesia:    Anesthesia method:  Local infiltration  Local anesthetic:  Lidocaine  1% w/o epi Procedure type:    Complexity:  Simple Procedure details:    Ultrasound guidance: yes     Incision types:  Stab incision   Wound management:  Probed and deloculated   Drainage:  Bloody and purulent   Drainage amount:  Scant   Wound treatment:  Wound left open   Packing materials:  None Post-procedure details:    Procedure completion:  Tolerated    MEDICATIONS ORDERED IN ED: Medications  lidocaine  (PF) (XYLOCAINE ) 1 % injection 5 mL (5 mLs Infiltration Given by Other 04/04/24 0735)     IMPRESSION / MDM / ASSESSMENT AND PLAN / ED COURSE  I reviewed the triage vital signs and the nursing notes.  Differential diagnosis includes, but is not limited to, abscess, cellulitis, hypertrophic skin from prior abscesses  Patient's presentation is most consistent with acute complicated illness / injury requiring diagnostic workup.  38 year old  female presenting with redness and swelling along her chest wall.  Exam and history concerning for abscess.  Fluctuance somewhat hard to note given  what appears to be scar tissue in the location as well.  Under ultrasound, small abscess was noted.  I&D was performed as above.  Will discharge with prescription for antibiotics and single day of pain medication.  Strict return precautions provided.  Patient discharged in stable condition.     FINAL CLINICAL IMPRESSION(S) / ED DIAGNOSES   Final diagnoses:  Abscess of chest wall     Rx / DC Orders   ED Discharge Orders          Ordered    sulfamethoxazole -trimethoprim  (BACTRIM  DS) 800-160 MG tablet  2 times daily        04/04/24 0802    HYDROcodone -acetaminophen  (NORCO/VICODIN) 5-325 MG tablet  Every 6 hours PRN        04/04/24 0802             Note:  This document was prepared using Dragon voice recognition software and may include unintentional dictation errors.   Levander Slate, MD 04/04/24 [phone]

## 2024-04-04 NOTE — Discharge Instructions (Signed)
 You were seen in the ER today for evaluation of swelling on your skin.  We did drain a small abscess for you.  I have sent a prescription for antibiotics to ensure that this area clears.  I will also send a short course of pain medication.  I have sent a prescription for a narcotic pain medicine for severe breathrough pain. Do not drink alcohol, drive or participate in any other potentially dangerous activities while taking this medication as it may make you sleepy. Do not take this medication with any other sedating medications, either prescription or over-the-counter.  This medication is intended for your use only - do not give any to anyone else and keep it in a secure place where nobody else, especially children, have access to it.  It can also cause or worsen constipation, so you may want to consider taking an over-the-counter stool softener while you are taking this medication.

## 2024-04-04 NOTE — ED Triage Notes (Signed)
 Pt here for abscess related to her HS that started on Th. Notes it started to drain on Saturday and has become increasing painful. Pt denies N/V/D/fever. Ibuprofen  @ 0530. Well appearing. Site covered.

## 2024-05-27 ENCOUNTER — Emergency Department: Payer: Self-pay

## 2024-05-27 ENCOUNTER — Emergency Department
Admission: EM | Admit: 2024-05-27 | Discharge: 2024-05-27 | Disposition: A | Discharge status: Home / Self Care | Payer: Self-pay | Attending: Family Medicine | Admitting: Family Medicine

## 2024-05-27 ENCOUNTER — Other Ambulatory Visit: Payer: Self-pay

## 2024-05-27 DIAGNOSIS — S060X0A Concussion without loss of consciousness, initial encounter: Secondary | ICD-10-CM | POA: Insufficient documentation

## 2024-05-27 DIAGNOSIS — M542 Cervicalgia: Secondary | ICD-10-CM | POA: Insufficient documentation

## 2024-05-27 DIAGNOSIS — W25XXXA Contact with sharp glass, initial encounter: Secondary | ICD-10-CM | POA: Insufficient documentation

## 2024-05-27 MED ORDER — KETOROLAC TROMETHAMINE 30 MG/ML IJ SOLN
30.0000 mg | Freq: Once | INTRAMUSCULAR | Status: AC
  Administered 2024-05-27: 30 mg via INTRAMUSCULAR
  Filled 2024-05-27: qty 1
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
## 2024-05-27 NOTE — Discharge Instructions (Signed)
 Continue medications previously prescribed.  Follow up with primary care if not improving over the weekend.  Return to the ER for symptoms that change, worsen, or for new concerns.
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
